# Patient Record
Sex: Female | Born: 1935
Health system: Southern US, Community
[De-identification: ages and names within clinical notes are randomized; demographics above are authoritative.]

## PROBLEM LIST (undated history)

## (undated) DIAGNOSIS — H3530 Unspecified macular degeneration: Secondary | ICD-10-CM

## (undated) DIAGNOSIS — C4491 Basal cell carcinoma of skin, unspecified: Secondary | ICD-10-CM

## (undated) HISTORY — PX: APPENDECTOMY: SHX54

## (undated) HISTORY — PX: ABDOMINAL HYSTERECTOMY: SHX81

## (undated) HISTORY — PX: CHOLECYSTECTOMY: SHX55

## (undated) HISTORY — DX: Basal cell carcinoma of skin, unspecified: C44.91

## (undated) HISTORY — DX: Unspecified macular degeneration: H35.30

---

## 2004-12-28 ENCOUNTER — Ambulatory Visit: Payer: Self-pay | Admitting: Family Medicine

## 2006-01-10 ENCOUNTER — Ambulatory Visit: Payer: Self-pay | Admitting: Family Medicine

## 2007-06-22 ENCOUNTER — Ambulatory Visit: Payer: Self-pay | Admitting: Family Medicine

## 2007-06-27 ENCOUNTER — Ambulatory Visit: Payer: Self-pay | Admitting: Family Medicine

## 2007-07-07 ENCOUNTER — Ambulatory Visit: Payer: Self-pay | Admitting: Surgery

## 2007-07-07 ENCOUNTER — Other Ambulatory Visit: Payer: Self-pay

## 2007-07-13 ENCOUNTER — Ambulatory Visit: Payer: Self-pay | Admitting: Surgery

## 2007-07-13 HISTORY — PX: BREAST EXCISIONAL BIOPSY: SUR124

## 2008-01-10 IMAGING — MG MAM BREAST NEEDLE LOCAL LT
1 series · 6 of 6 positions shown · non-contrast
Comparison: none

REASON FOR EXAM: left breast mass        surg 11am
COMMENTS:

[Series 6977: L LM · left · 6 of 6 slices shown]
[im 1/6]
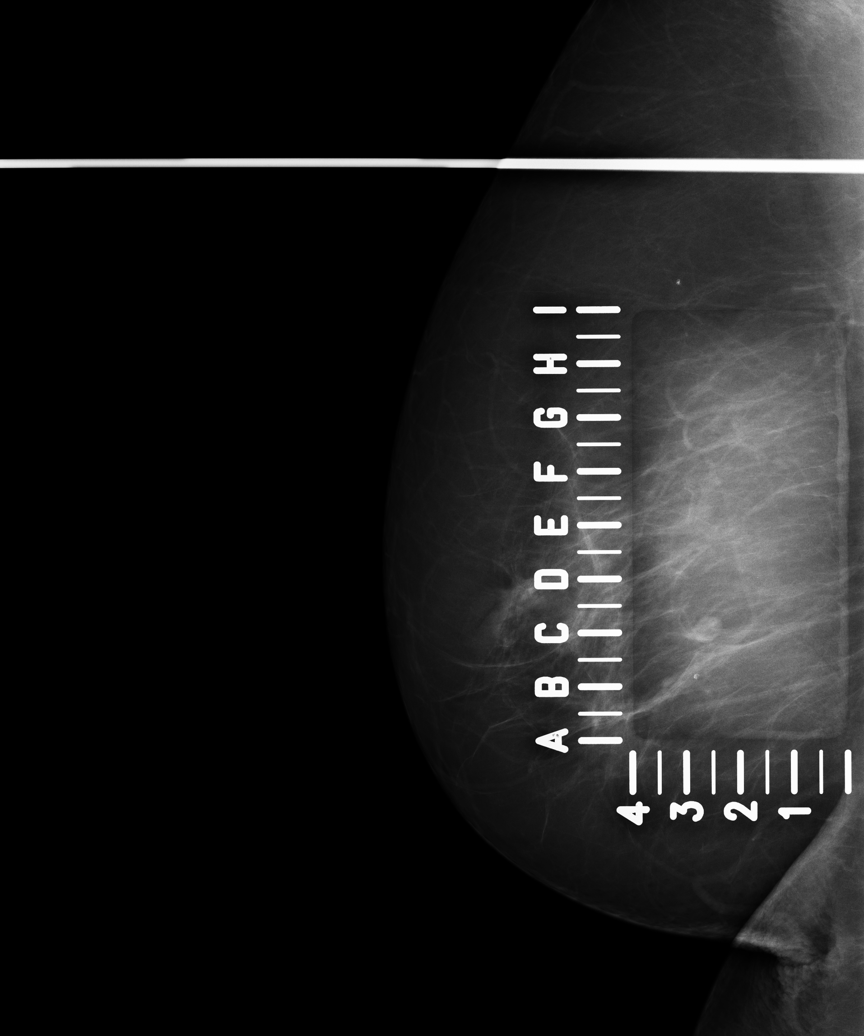
[im 2/6]
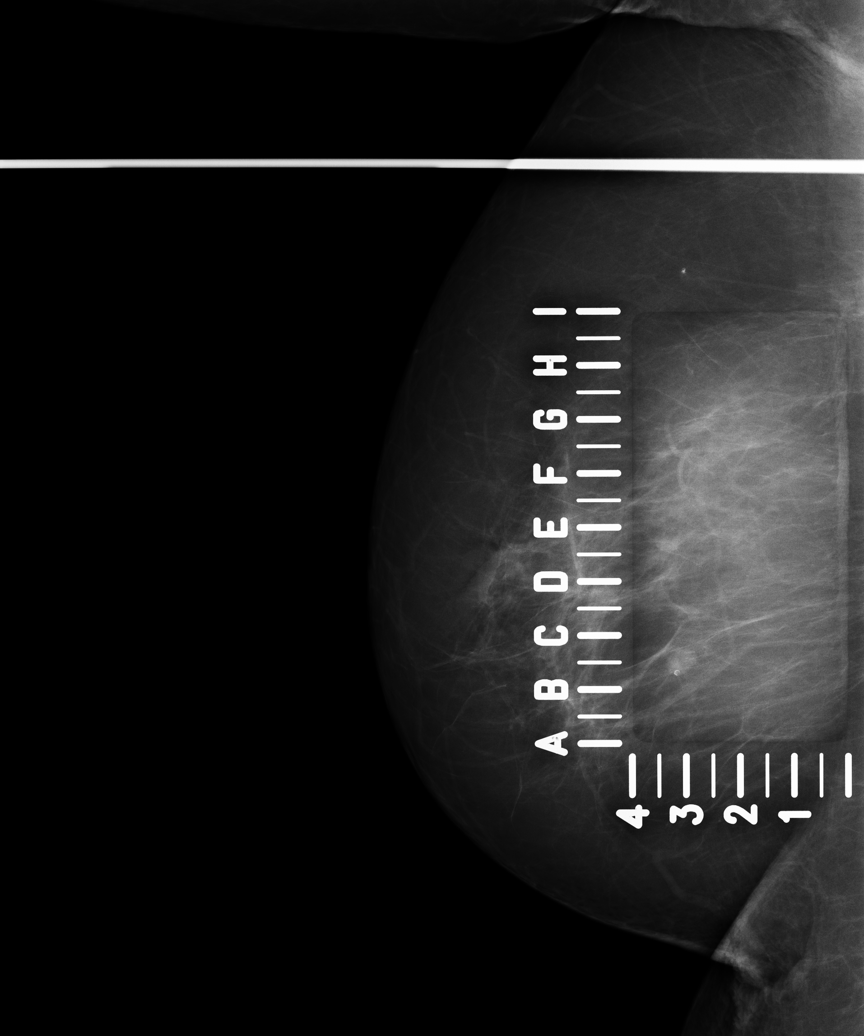
[im 3/6]
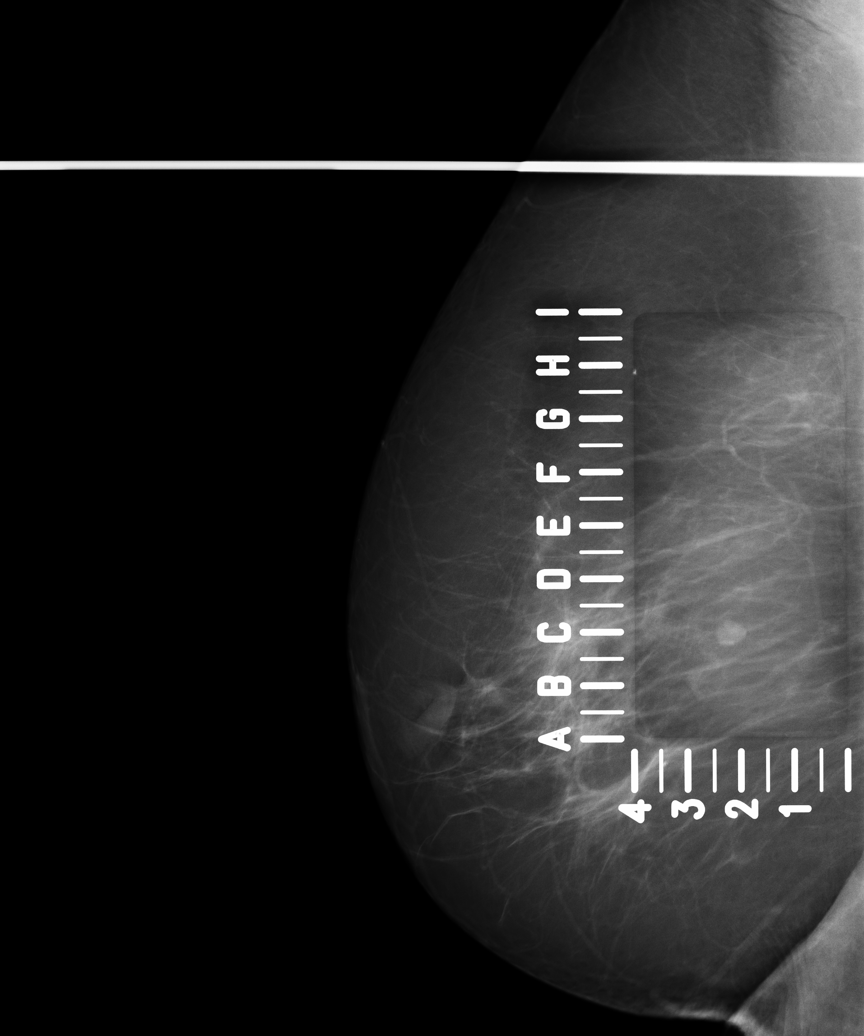
[im 4/6]
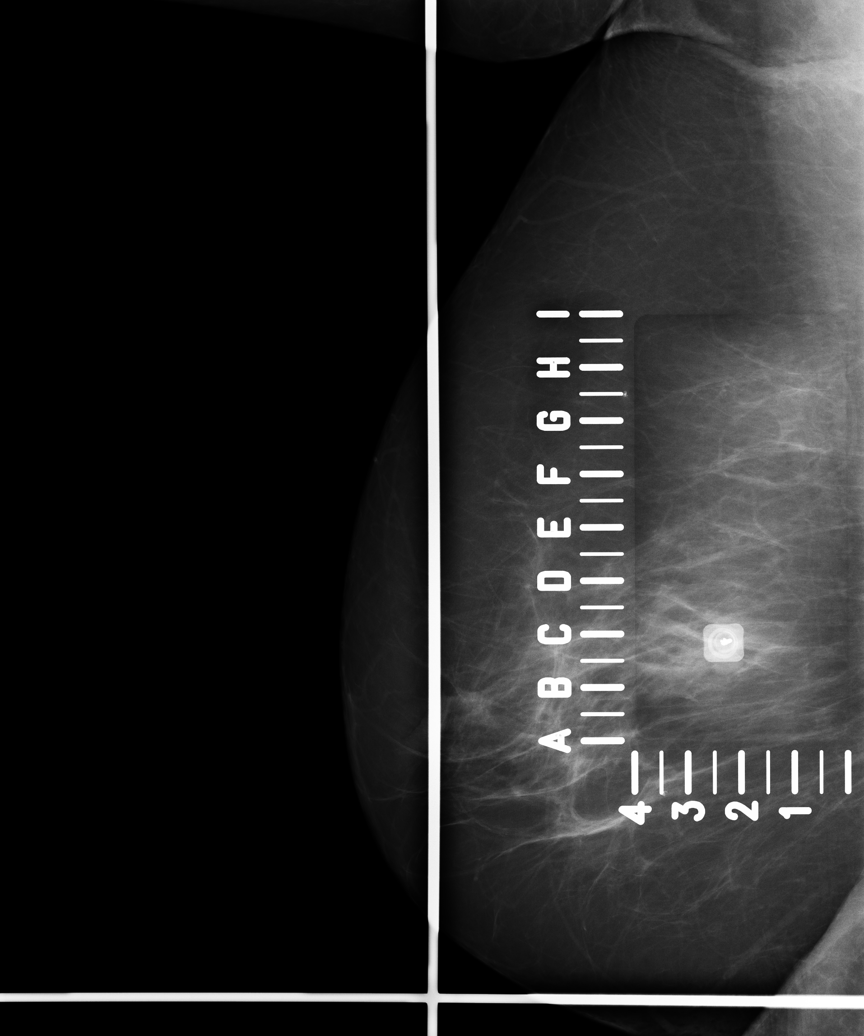
[im 5/6]
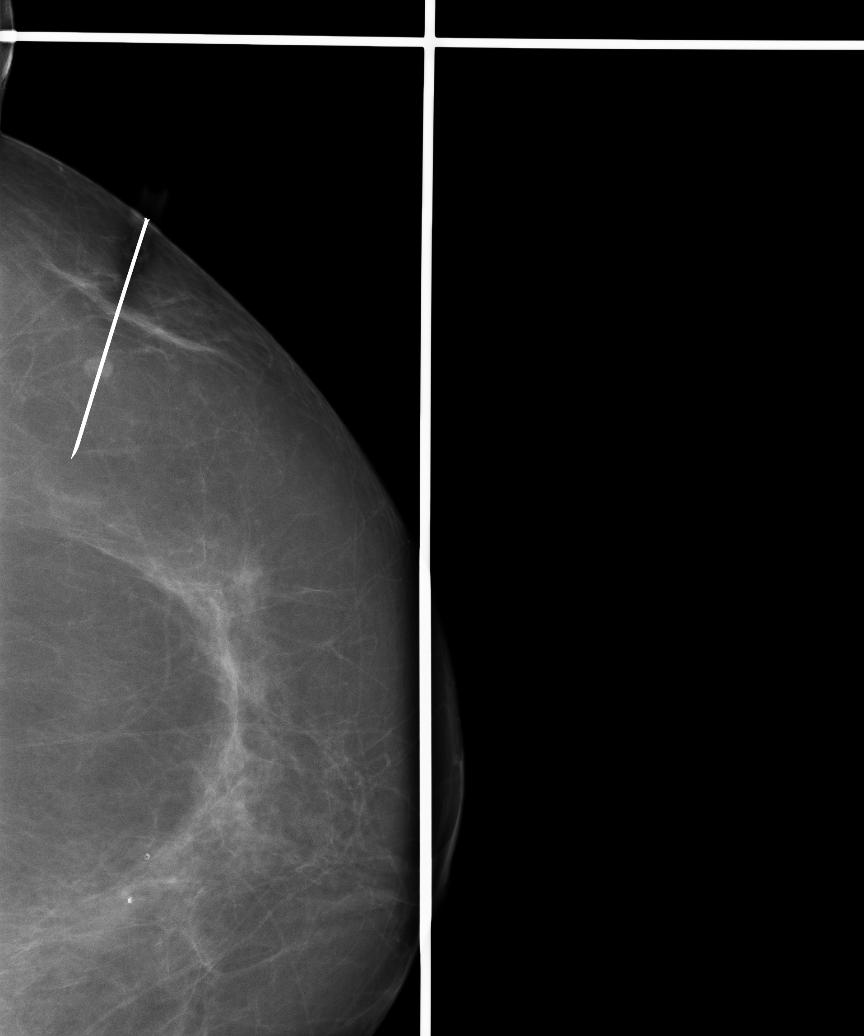
[im 6/6]
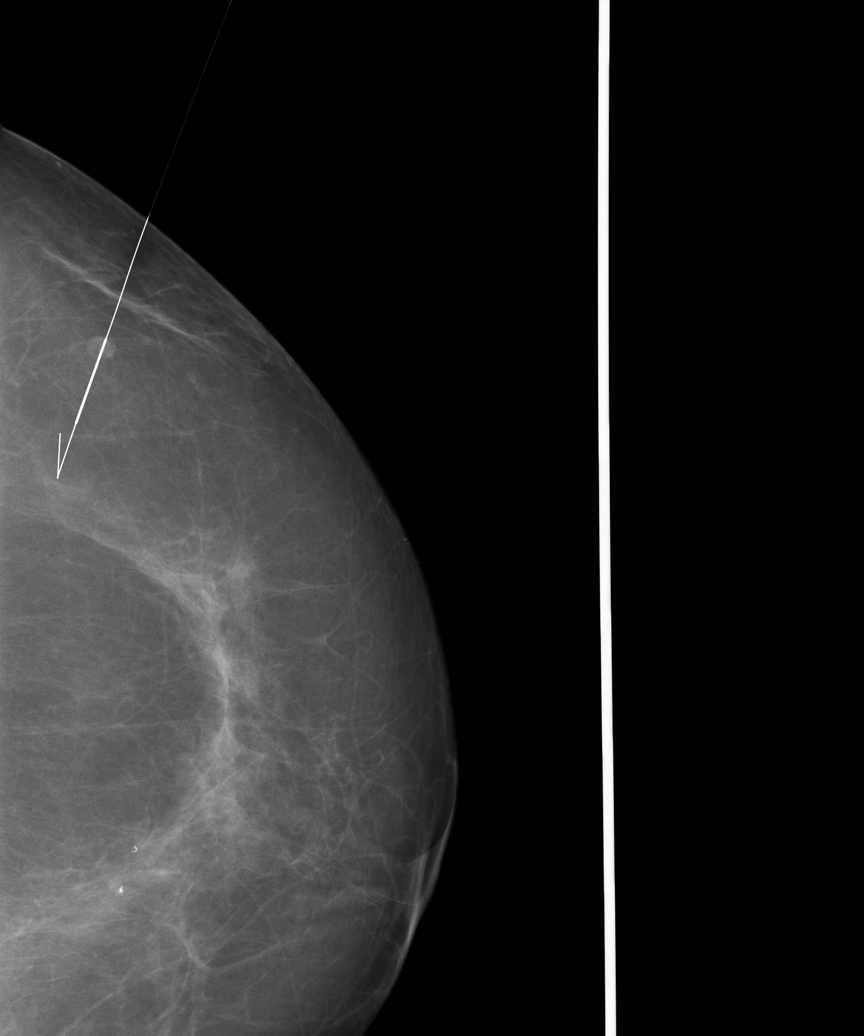

[6 of 6 positions shown; findings below may reference images not displayed]

PROCEDURE:     MAM - MAM BREAST NEEDLE LOCAL LT  - July 13, 2007  [DATE]

RESULT:     Comparison: Left mammogram on 06/22/07 and 06/27/2007.

Procedure:

The risks, benefits, and alternatives of the procedure were discussed with
the patient. Written and oral informed consent were obtained. The patient
was placed in coordinate grid and the 5 mm left outer breast nodule was
localized using digital imaging.

Using sterile technique and local anesthesia with 1% lidocaine, a Kopans
hook wire was placed at the level of the left outer breast nodule from a
lateral approach. The nodule is along the proximal reinforced portion of
wire.

Two views mammogram documenting position the wire were sent to the operating
room with the patient.

The patient tolerated procedure without immediate complications.
IMPRESSION: 1. Successful wire localization of suspicious 5 mm nodule involving the left
outer breast.

## 2009-05-29 ENCOUNTER — Ambulatory Visit: Payer: Self-pay | Admitting: Family Medicine

## 2010-06-04 ENCOUNTER — Ambulatory Visit: Payer: Self-pay | Admitting: Family Medicine

## 2011-06-17 ENCOUNTER — Ambulatory Visit: Payer: Self-pay | Admitting: Family Medicine

## 2012-07-04 ENCOUNTER — Ambulatory Visit: Payer: Self-pay | Admitting: Family Medicine

## 2013-07-17 ENCOUNTER — Ambulatory Visit: Payer: Self-pay | Admitting: Ophthalmology

## 2013-09-20 ENCOUNTER — Ambulatory Visit: Payer: Self-pay | Admitting: Family Medicine

## 2014-09-25 ENCOUNTER — Ambulatory Visit: Payer: Self-pay | Admitting: Family Medicine

## 2014-11-27 ENCOUNTER — Ambulatory Visit: Payer: Self-pay | Admitting: Ophthalmology

## 2014-12-30 DIAGNOSIS — Z961 Presence of intraocular lens: Secondary | ICD-10-CM | POA: Diagnosis not present

## 2015-06-30 DIAGNOSIS — Z961 Presence of intraocular lens: Secondary | ICD-10-CM | POA: Diagnosis not present

## 2016-03-19 ENCOUNTER — Ambulatory Visit (INDEPENDENT_AMBULATORY_CARE_PROVIDER_SITE_OTHER): Payer: Commercial Managed Care - HMO

## 2016-03-19 ENCOUNTER — Ambulatory Visit
Admission: EM | Admit: 2016-03-19 | Discharge: 2016-03-19 | Disposition: A | Discharge status: Home / Self Care | Payer: Commercial Managed Care - HMO | Attending: Family Medicine | Admitting: Family Medicine

## 2016-03-19 ENCOUNTER — Encounter: Payer: Self-pay | Admitting: Emergency Medicine

## 2016-03-19 DIAGNOSIS — S81811A Laceration without foreign body, right lower leg, initial encounter: Secondary | ICD-10-CM

## 2016-03-19 DIAGNOSIS — S8011XA Contusion of right lower leg, initial encounter: Secondary | ICD-10-CM

## 2016-03-19 DIAGNOSIS — M79604 Pain in right leg: Secondary | ICD-10-CM | POA: Diagnosis not present

## 2016-03-19 DIAGNOSIS — S80211A Abrasion, right knee, initial encounter: Secondary | ICD-10-CM

## 2016-03-19 DIAGNOSIS — W108XXA Fall (on) (from) other stairs and steps, initial encounter: Secondary | ICD-10-CM

## 2016-03-19 MED ORDER — TETANUS-DIPHTH-ACELL PERTUSSIS 5-2.5-18.5 LF-MCG/0.5 IM SUSP
0.5000 mL | Freq: Once | INTRAMUSCULAR | Status: AC
  Administered 2016-03-19: 0.5 mL via INTRAMUSCULAR

## 2016-03-19 MED ORDER — AMOXICILLIN-POT CLAVULANATE 875-125 MG PO TABS: 1.0000 | ORAL_TABLET | Freq: Two times a day (BID) | ORAL | Status: DC

## 2016-03-19 NOTE — ED Notes (Addendum)
Patient states that she fell as she was going outside on her porch and hit her right leg.  Patient has an abrasion on her right knee and a puncture wound to her right lower leg.  Patient has pain and swelling in her right lower leg at the puncture site.  Patient has some bleeding at this time.  Pressure dressing has been applied.

## 2016-03-19 NOTE — Discharge Instructions (Signed)
Take medication as prescribed. Rest. Drink plenty of fluids. Apply ice and elevate. Keep clean, clean daily with soap and water as discussed.   Follow up with your primary care physician this week, in 3 days for follow up.Call to schedule.   Return to Urgent care or ER for redness, swelling, drainage, uncontrolled bleeding, headache, chest pain, shortness of breath, new or worsening concerns.    Contusion A contusion is a deep bruise. Contusions are the result of a blunt injury to tissues and muscle fibers under the skin. The injury causes bleeding under the skin. The skin overlying the contusion may turn blue, purple, or yellow. Minor injuries will give you a painless contusion, but more severe contusions may stay painful and swollen for a few weeks.  CAUSES  This condition is usually caused by a blow, trauma, or direct force to an area of the body. SYMPTOMS  Symptoms of this condition include:  Swelling of the injured area.  Pain and tenderness in the injured area.  Discoloration. The area may have redness and then turn blue, purple, or yellow. DIAGNOSIS  This condition is diagnosed based on a physical exam and medical history. An X-ray, CT scan, or MRI may be needed to determine if there are any associated injuries, such as broken bones (fractures). TREATMENT  Specific treatment for this condition depends on what area of the body was injured. In general, the best treatment for a contusion is resting, icing, applying pressure to (compression), and elevating the injured area. This is often called the RICE strategy. Over-the-counter anti-inflammatory medicines may also be recommended for pain control.  HOME CARE INSTRUCTIONS   Rest the injured area.  If directed, apply ice to the injured area:  Put ice in a plastic bag.  Place a towel between your skin and the bag.  Leave the ice on for 20 minutes, 2-3 times per day.  If directed, apply light compression to the injured area using  an elastic bandage. Make sure the bandage is not wrapped too tightly. Remove and reapply the bandage as directed by your health care provider.  If possible, raise (elevate) the injured area above the level of your heart while you are sitting or lying down.  Take over-the-counter and prescription medicines only as told by your health care provider. SEEK MEDICAL CARE IF:  Your symptoms do not improve after several days of treatment.  Your symptoms get worse.  You have difficulty moving the injured area. SEEK IMMEDIATE MEDICAL CARE IF:   You have severe pain.  You have numbness in a hand or foot.  Your hand or foot turns pale or cold.   This information is not intended to replace advice given to you by your health care provider. Make sure you discuss any questions you have with your health care provider.   Document Released: 09/01/2005 Document Revised: 08/13/2015 Document Reviewed: 04/09/2015 Elsevier Interactive Patient Education 2016 [place] An abrasion is a cut or scrape on the outer surface of your skin. An abrasion does not extend through all of the layers of your skin. It is important to care for your abrasion properly to prevent infection. CAUSES Most abrasions are caused by falling on or gliding across the ground or another surface. When your skin rubs on something, the outer and inner layer of skin rubs off.  SYMPTOMS A cut or scrape is the main symptom of this condition. The scrape may be bleeding, or it may appear red or pink. If there  was an associated fall, there may be an underlying bruise. DIAGNOSIS An abrasion is diagnosed with a physical exam. TREATMENT Treatment for this condition depends on how large and deep the abrasion is. Usually, your abrasion will be cleaned with water and mild soap. This removes any dirt or debris that may be stuck. An antibiotic ointment may be applied to the abrasion to help prevent infection. A bandage (dressing) may be  placed on the abrasion to keep it clean. You may also need a tetanus shot. HOME CARE INSTRUCTIONS Medicines  Take or apply medicines only as directed by your health care provider.  If you were prescribed an antibiotic ointment, finish all of it even if you start to feel better. Wound Care  Clean the wound with mild soap and water 2-3 times per day or as directed by your health care provider. Pat your wound dry with a clean towel. Do not rub it.  There are many different ways to close and cover a wound. Follow instructions from your health care provider about:  Wound care.  Dressing changes and removal.  Check your wound every day for signs of infection. Watch for:  Redness, swelling, or pain.  Fluid, blood, or pus. General Instructions  Keep the dressing dry as directed by your health care provider. Do not take baths, swim, use a hot tub, or do anything that would put your wound underwater until your health care provider approves.  If there is swelling, raise (elevate) the injured area above the level of your heart while you are sitting or lying down.  Keep all follow-up visits as directed by your health care provider. This is important. SEEK MEDICAL CARE IF:  You received a tetanus shot and you have swelling, severe pain, redness, or bleeding at the injection site.  Your pain is not controlled with medicine.  You have increased redness, swelling, or pain at the site of your wound. SEEK IMMEDIATE MEDICAL CARE IF:  You have a red streak going away from your wound.  You have a fever.  You have fluid, blood, or pus coming from your wound.  You notice a bad smell coming from your wound or your dressing.   This information is not intended to replace advice given to you by your health care provider. Make sure you discuss any questions you have with your health care provider.   Document Released: 09/01/2005 Document Revised: 08/13/2015 Document Reviewed: 11/20/2014 Elsevier  Interactive Patient Education 2016 [place], Adult A laceration is a cut that goes through all layers of the skin. The cut also goes into the tissue that is right under the skin. Some cuts heal on their own. Others need to be closed with stitches (sutures), staples, skin adhesive strips, or wound glue. Taking care of your cut lowers your risk of infection and helps your cut to heal better. HOW TO TAKE CARE OF YOUR CUT For stitches or staples:  Keep the wound clean and dry.  If you were given a bandage (dressing), you should change it at least one time per day or as told by your doctor. You should also change it if it gets wet or dirty.  Keep the wound completely dry for the first 24 hours or as told by your doctor. After that time, you may take a shower or a bath. However, make sure that the wound is not soaked in water until after the stitches or staples have been removed.  Clean the wound one time  each day or as told by your doctor:  Wash the wound with soap and water.  Rinse the wound with water until all of the soap comes off.  Pat the wound dry with a clean towel. Do not rub the wound.  After you clean the wound, put a thin layer of antibiotic ointment on it as told by your doctor. This ointment:  Helps to prevent infection.  Keeps the bandage from sticking to the wound.  Have your stitches or staples removed as told by your doctor. If your doctor used skin adhesive strips:   Keep the wound clean and dry.  If you were given a bandage, you should change it at least one time per day or as told by your doctor. You should also change it if it gets dirty or wet.  Do not get the skin adhesive strips wet. You can take a shower or a bath, but be careful to keep the wound dry.  If the wound gets wet, pat it dry with a clean towel. Do not rub the wound.  Skin adhesive strips fall off on their own. You can trim the strips as the wound heals. Do not remove any  strips that are still stuck to the wound. They will fall off after a while. If your doctor used wound glue:  Try to keep your wound dry, but you may briefly wet it in the shower or bath. Do not soak the wound in water, such as by swimming.  After you take a shower or a bath, gently pat the wound dry with a clean towel. Do not rub the wound.  Do not do any activities that will make you really sweaty until the skin glue has fallen off on its own.  Do not apply liquid, cream, or ointment medicine to your wound while the skin glue is still on.  If you were given a bandage, you should change it at least one time per day or as told by your doctor. You should also change it if it gets dirty or wet.  If a bandage is placed over the wound, do not let the tape for the bandage touch the skin glue.  Do not pick at the glue. The skin glue usually stays on for 5-10 days. Then, it falls off of the skin. General Instructions  To help prevent scarring, make sure to cover your wound with sunscreen whenever you are outside after stitches are removed, after adhesive strips are removed, or when wound glue stays in place and the wound is healed. Make sure to wear a sunscreen of at least 30 SPF.  Take over-the-counter and prescription medicines only as told by your doctor.  If you were given antibiotic medicine or ointment, take or apply it as told by your doctor. Do not stop using the antibiotic even if your wound is getting better.  Do not scratch or pick at the wound.  Keep all follow-up visits as told by your doctor. This is important.  Check your wound every day for signs of infection. Watch for:  Redness, swelling, or pain.  Fluid, blood, or pus.  Raise (elevate) the injured area above the level of your heart while you are sitting or lying down, if possible. GET HELP IF:  You got a tetanus shot and you have any of these problems at the injection site:  Swelling.  Very bad  pain.  Redness.  Bleeding.  You have a fever.  A wound that was closed breaks  open.  You notice a bad smell coming from your wound or your bandage.  You notice something coming out of the wound, such as wood or glass.  Medicine does not help your pain.  You have more redness, swelling, or pain at the site of your wound.  You have fluid, blood, or pus coming from your wound.  You notice a change in the color of your skin near your wound.  You need to change the bandage often because fluid, blood, or pus is coming from the wound.  You start to have a new rash.  You start to have numbness around the wound. GET HELP RIGHT AWAY IF:  You have very bad swelling around the wound.  Your pain suddenly gets worse and is very bad.  You notice painful lumps near the wound or on skin that is anywhere on your body.  You have a red streak going away from your wound.  The wound is on your hand or foot and you cannot move a finger or toe like you usually can.  The wound is on your hand or foot and you notice that your fingers or toes look pale or bluish.   This information is not intended to replace advice given to you by your health care provider. Make sure you discuss any questions you have with your health care provider.   Document Released: 05/10/2008 Document Revised: 04/08/2015 Document Reviewed: 11/18/2014 Elsevier Interactive Patient Education [employer].

## 2016-03-19 NOTE — ED Provider Notes (Signed)
[facility]  ____________________________________________  Time seen: Approximately 9:59 AM  I have reviewed the triage vital signs and the nursing notes.   HISTORY  Chief Complaint Fall and Puncture Wound   HPI Kerri Harmon is a 82 y.o. female patient presents for the complaint of right leg pain and bleeding. Patient reports that yesterday evening approximately 4 PM she was going into her house. Patient reports that she has 3 or 4 steps that go up onto her porch in front of her front door. Patient states that her son had just left as she was outside talking to him. Patient states that she started going up the stairs and turned around and looked at his car driving away, and states that she tripped as she lifted her foot to go to the next step. Patient states that when she had her foot on the step causing her to trip forward and landed on her right knee. Patient states that her weight landed on her right knee. Patient states that she then did secondarily fall to her buttocks. Patient states that she feels that she may have hit her head on the handrail. Denies loss consciousness. Denies any other injury. Denies any recent falls.  Patient reports that she drove herself to the urgent care today. Patient states that she waited to come in until today as she was not hurting at the time. Patient states that she only has mild pain around the injury site. Patient reports that she came in tonight primarily because the wound area has continued to intermittently bleed.  Denies headache, vision changes, loss of consciousness, neck pain, back pain, chest pain, shortness of breath, weakness, dizziness, abdominal pain, other extremity pain. Patient reports that she fell only because she tripped. Patient reports that she felt well prior to falling and states that her only complaint after falling was her right leg. Reports has continued to remain ambulatory and active since the event.  PCP Dr.  Ronnald Ramp.  Reports unsure of last tetanus immunization.   History reviewed. No pertinent past medical history. Denies There are no active problems to display for this patient. Denies  Past Surgical History  Procedure Laterality Date  . Abdominal hysterectomy    . Cholecystectomy    . Appendectomy      Current Outpatient Rx  Name  Route  Sig  Dispense  Refill  . amoxicillin-clavulanate (AUGMENTIN) 875-125 MG tablet   Oral   Take 1 tablet by mouth every 12 (twelve) hours.   20 tablet   0     Allergies Review of patient's allergies indicates no known allergies.  History reviewed. No pertinent family history.  Social History Social History  Substance Use Topics  . Smoking status: Never Smoker   . Smokeless tobacco: None  . Alcohol Use: No    Review of Systems Constitutional: No fever/chills Eyes: No visual changes. ENT: No sore throat. Cardiovascular: Denies chest pain. Respiratory: Denies shortness of breath. Gastrointestinal: No abdominal pain.  No nausea, no vomiting.  No diarrhea.  No constipation. Genitourinary: Negative for dysuria. Musculoskeletal: Negative for back pain. Skin: Negative for rash.Right lower leg laceration. Neurological: Negative for headaches, focal weakness or numbness.  10-point ROS otherwise negative.  ____________________________________________   PHYSICAL EXAM:  VITAL SIGNS: ED Triage Vitals  Enc Vitals Group     BP 03/19/16 0833 168/69 mmHg     Pulse Rate 03/19/16 0833 66     Resp 03/19/16 0833 17     Temp 03/19/16 0833 97.8  F (36.6 C)     Temp Source 03/19/16 0833 Tympanic     SpO2 03/19/16 0833 100 %     Weight 03/19/16 0833 130 lb (58.968 kg)     Height 03/19/16 0833 5\' 3"  (1.6 m)     Head Cir --      Peak Flow --      Pain Score 03/19/16 0837 3     Pain Loc --      Pain Edu? --      Excl. in [place]? --     Constitutional: Alert and oriented. Well appearing and in no acute distress. Eyes: Conjunctivae are normal.  PERRL. EOMI. Head: Very small yellowish healing ecchymosis to left forehead, nontender, no swelling, skin intact. No other ecchymosis, swelling, hematoma or signs of trauma..  Ears: no erythema, normal TMs bilaterally.   Nose: No congestion/rhinnorhea.  Mouth/Throat: Mucous membranes are moist.  Oropharynx non-erythematous. Neck: No stridor.  No cervical spine tenderness to palpation. Hematological/Lymphatic/Immunilogical: No cervical lymphadenopathy. Cardiovascular: Normal rate, regular rhythm. Grossly normal heart sounds.  Good peripheral circulation. Chest nontender. Respiratory: Normal respiratory effort.  No retractions. Lungs CTAB. No wheezes, rales or rhonchi. Gastrointestinal: Soft and nontender. No distention. Normal Bowel sounds. No CVA tenderness. Musculoskeletal: No lower or upper extremity tenderness nor edema.  No joint effusions. Bilateral pedal pulses equal and easily palpated. No cervical, thoracic or lumbar tenderness to palpation. Except: Right anterior knee with superficial abrasions 2, nontender, no ecchymosis, no swelling, full range of motion, no pain to the knee, and knee stable. Right proximal tibial area with moderate ecchymosis, mild swelling and with 0.25 cm laceration present without active bleeding, no foreign bodies visualized, full range of motion, no calf tenderness, no posterior leg tenderness, no lower leg swelling, bilateral pedal pulses equal and easily palpable, and plantar and dorsal flexion strong and equal, 2+ Achilles reflex bilaterally, steady gait in room, changes position quickly from sitting to standing and ambulating.  Neurologic:  Normal speech and language. No gross focal neurologic deficits are appreciated. No gait instability. GCS 15. Steady gait. No ataxia. Normal finger to nose. 5 out of 5 strength to bilateral upper and lower extremities. Cranial nerves II-12 grossly intact. Skin:  Skin is warm, dry and intact. No rash noted. See musculoskeletal  above. Psychiatric: Mood and affect are normal. Speech and behavior are normal.  ____________________________________________   LABS (all labs ordered are listed, but only abnormal results are displayed)  Labs Reviewed - No data to display rADIOLOGY   EXAM: RIGHT TIBIA AND FIBULA - 2 VIEW  COMPARISON: None.  FINDINGS: There is no evidence of fracture or other focal bone lesions. Soft tissues are unremarkable.  IMPRESSION: No acute abnormality noted.   Electronically Signed By: Inez Catalina M.D. On: 03/19/2016 09:13  I, Marylene Land, personally viewed and evaluated these images (plain radiographs) as part of my medical decision making, as well as reviewing the written report by the radiologist.  ____________________________________________   PROCEDURES  Procedure(s) performed:Procedure(s) performed:  Procedure explained and verbal consent obtained. Consent: Verbal consent obtained. Written consent not obtained. Risks and benefits: risks, benefits and alternatives were discussed Patient identity confirmed: verbally with patient and hospital-assigned identification number  Consent given by: patient   Laceration Repair Location: Right proximal tibial area Length: 0.25 cm Foreign bodies: no foreign bodies Tendon involvement: none Nerve involvement: none Preparation: Patient was prepped and draped in the usual sterile fashion. Anesthesia : Patient denies need for Irrigation solution: saline and Betadine  Irrigation  method: jet lavage Amount of cleaning: copious No repair indicated. Approximation: loose Patient tolerate well. Wound well approximated post repair.  Surgicel applied to the wound. Antibiotic ointment and dressing applied.  Wound care instructions provided.  Observe for any signs of infection or other problems.   \________________________________________   INITIAL IMPRESSION / ASSESSMENT AND PLAN / ED COURSE  Pertinent labs & imaging results  that were available during my care of the patient were reviewed by me and considered in my medical decision making (see chart for details).  Very well-appearing patient. No acute distress. Presents for complaints of right proximal tibial pain and swelling with wound which occurred from a mechanical fall yesterday afternoon. Patient reports going up her steps into her house and tripped causing her to fall forward. Patient states that she thinks she tripped over the third step. Denies any other pain or injury. Denies loss of consciousness. No focal neurological deficits. Alert and oriented. Patient denies headache, vision changes, weakness, dizziness, nausea, vomiting . She reports felt fine prior to fall and after expect right shin. Patient with 0.25 cm wound to proximal tibial area as well as superficial abrasions. Wound has been open greater than 12 hours, no closure indicated. Wound copiously irrigated with Betadine and saline. Surgicel applied to the wound. Directed and cleaning and monitoring area. As well as can greater than 12 hours will place patient on oral Augmentin. Encourage PCP follow up in 2 days. Denies pain at this time. Tetanus immunization updated. Encouraged patient to monitor blood pressure at home and follow up with her primary care physician. Patient states her blood pressures is elevated today as she is stressed that she does not want to be in urgent care at this time.   Discussed follow up with Primary care physician this week. Discussed follow up and return parameters including no resolution or any worsening concerns. Patient verbalized understanding and agreed to plan.   ____________________________________________   FINAL CLINICAL IMPRESSION(S) / ED DIAGNOSES  Final diagnoses:  Contusion of right leg, initial encounter  Abrasion of right knee, initial encounter  Leg laceration, right, initial encounter  Fall (on) (from) other stairs and steps, initial encounter       Note: This dictation was prepared with Dragon dictation along with smaller phrase technology. Any transcriptional errors that result from this process are unintentional.    Marylene Land, NP 03/19/16 2326

## 2016-03-22 ENCOUNTER — Ambulatory Visit (INDEPENDENT_AMBULATORY_CARE_PROVIDER_SITE_OTHER): Payer: Commercial Managed Care - HMO | Admitting: Family Medicine

## 2016-03-22 ENCOUNTER — Encounter: Payer: Self-pay | Admitting: Family Medicine

## 2016-03-22 VITALS — BP 128/78 | HR 60 | Ht 63.0 in | Wt 134.0 lb

## 2016-03-22 DIAGNOSIS — R030 Elevated blood-pressure reading, without diagnosis of hypertension: Secondary | ICD-10-CM | POA: Diagnosis not present

## 2016-03-22 DIAGNOSIS — S8011XA Contusion of right lower leg, initial encounter: Secondary | ICD-10-CM | POA: Insufficient documentation

## 2016-03-22 NOTE — Progress Notes (Signed)
Name: Kerri Harmon   MRN: [id number]    DOB: 27-Oct-1931   Date:03/22/2016       Progress Note  Subjective  Chief Complaint  Chief Complaint  Patient presents with  . Fall    follow up urgent care/fell x 4 days ago- "lost my balance on the last step and fell"    Fall Pertinent negatives include no abdominal pain, fever, headaches, hematuria, nausea or tingling.  Hypertension This is a chronic problem. The current episode started more than 1 year ago. The problem has been gradually improving since onset. The problem is controlled. Pertinent negatives include no anxiety, blurred vision, chest pain, headaches, malaise/fatigue, neck pain, orthopnea, palpitations, peripheral edema, PND, shortness of breath or sweats. There are no associated agents to hypertension. Risk factors for coronary artery disease include post-menopausal state. Past treatments include nothing. The current treatment provides mild improvement. There are no compliance problems.  There is no history of angina, kidney disease, CAD/MI, CVA, heart failure, left ventricular hypertrophy, PVD, renovascular disease or retinopathy. There is no history of chronic renal disease or a hypertension causing med.  Laceration  The incident occurred 5 to 7 days ago. The laceration is located on the right leg. The laceration is 1 cm in size. The laceration mechanism was a blunt object. The pain is mild. The pain has been intermittent since onset. She reports no foreign bodies present. Her tetanus status is UTD.    No problem-specific assessment & plan notes found for this encounter.   History reviewed. No pertinent past medical history.  Past Surgical History  Procedure Laterality Date  . Abdominal hysterectomy    . Cholecystectomy    . Appendectomy      History reviewed. No pertinent family history.  Social History   Social History  . Marital Status: Widowed    Spouse Name: N/A  . Number of Children: N/A  . Years of  Education: N/A   Occupational History  . Not on file.   Social History Main Topics  . Smoking status: Never Smoker   . Smokeless tobacco: Not on file  . Alcohol Use: No  . Drug Use: Not on file  . Sexual Activity: No   Other Topics Concern  . Not on file   Social History Narrative    No Known Allergies   Review of Systems  Constitutional: Negative for fever, chills, weight loss and malaise/fatigue.  HENT: Negative for ear discharge, ear pain and sore throat.   Eyes: Negative for blurred vision.  Respiratory: Negative for cough, sputum production, shortness of breath and wheezing.   Cardiovascular: Negative for chest pain, palpitations, orthopnea, leg swelling and PND.  Gastrointestinal: Negative for heartburn, nausea, abdominal pain, diarrhea, constipation, blood in stool and melena.  Genitourinary: Negative for dysuria, urgency, frequency and hematuria.  Musculoskeletal: Negative for myalgias, back pain, joint pain and neck pain.  Skin: Negative for rash.  Neurological: Negative for dizziness, tingling, sensory change, focal weakness and headaches.  Endo/Heme/Allergies: Negative for environmental allergies and polydipsia. Does not bruise/bleed easily.  Psychiatric/Behavioral: Negative for depression and suicidal ideas. The patient is not nervous/anxious and does not have insomnia.      Objective  Filed Vitals:   03/22/16 0916  BP: 128/78  Pulse: 60  Height: 5\' 3"  (1.6 m)  Weight: 134 lb (60.782 kg)    Physical Exam  Constitutional: She is well-developed, well-nourished, and in no distress. No distress.  HENT:  Head: Normocephalic and atraumatic.  Right Ear: External ear  normal.  Left Ear: External ear normal.  Nose: Nose normal.  Mouth/Throat: Oropharynx is clear and moist.  Eyes: Conjunctivae and EOM are normal. Pupils are equal, round, and reactive to light. Right eye exhibits no discharge. Left eye exhibits no discharge.  Neck: Normal range of motion. Neck  supple. No JVD present. No thyromegaly present.  Cardiovascular: Normal rate, regular rhythm, normal heart sounds and intact distal pulses.  Exam reveals no gallop and no friction rub.   No murmur heard. Pulmonary/Chest: Effort normal and breath sounds normal.  Abdominal: Soft. Bowel sounds are normal. She exhibits no mass. There is no tenderness. There is no guarding.  Musculoskeletal: Normal range of motion. She exhibits no edema.  Lymphadenopathy:    She has no cervical adenopathy.  Neurological: She is alert. She has normal reflexes.  Skin: Skin is warm and dry. She is not diaphoretic.  Psychiatric: Mood and affect normal.  Nursing note and vitals reviewed.     Assessment & Plan  Problem List Items Addressed This Visit    None    Visit Diagnoses    Elevated blood pressure (not hypertension)    -  Primary      Walked pt to urgent care for redressing   Dr. Otilio Miu [facility]  03/22/2016

## 2016-03-22 NOTE — Patient Instructions (Signed)

## 2016-05-04 ENCOUNTER — Ambulatory Visit
Admission: EM | Admit: 2016-05-04 | Discharge: 2016-05-04 | Disposition: A | Discharge status: Home / Self Care | Payer: Commercial Managed Care - HMO | Attending: Family Medicine | Admitting: Family Medicine

## 2016-05-04 ENCOUNTER — Encounter: Payer: Self-pay | Admitting: *Deleted

## 2016-05-04 DIAGNOSIS — H6981 Other specified disorders of Eustachian tube, right ear: Secondary | ICD-10-CM | POA: Diagnosis not present

## 2016-05-04 DIAGNOSIS — H9193 Unspecified hearing loss, bilateral: Secondary | ICD-10-CM | POA: Diagnosis not present

## 2016-05-04 MED ORDER — FEXOFENADINE-PSEUDOEPHED ER 60-120 MG PO TB12: 1.0000 | ORAL_TABLET | Freq: Two times a day (BID) | ORAL | Status: DC

## 2016-05-04 MED ORDER — FLUTICASONE PROPIONATE 50 MCG/ACT NA SUSP: 2.0000 | Freq: Every day | NASAL | Status: DC

## 2016-05-04 NOTE — ED Provider Notes (Signed)
CSN: [id number]     Arrival date & time 05/04/16  [id number] History   First MD Initiated Contact with Patient 05/04/16 [phone]    Nurses notes were reviewed. Chief Complaint  Patient presents with  . Ear Fullness    Patient has had no ear fullness and decreased hearing over the last several months. She denies any fever or new injury but states that her hearing is progressively getting worse. She reports sometimes with friends and said that she's having some wheezing but she is not coughing up anything. She states that she does not have any medical problems she still works a lot at home she motor 2 acre property yesterday and that she is on no medication at this time. She reports the hearing is becoming increasingly worse.    (Consider location/radiation/quality/duration/timing/severity/associated sxs/prior Treatment) Patient is a 82 y.o. female presenting with plugged ear sensation. The history is provided by the patient. No language interpreter was used.  Ear Fullness This is a new problem. The current episode started more than 1 week ago. The problem occurs constantly. The problem has been gradually worsening. Pertinent negatives include no chest pain, no abdominal pain, no headaches and no shortness of breath. Nothing aggravates the symptoms. Nothing relieves the symptoms. She has tried nothing for the symptoms. The treatment provided no relief.    History reviewed. No pertinent past medical history. Past Surgical History  Procedure Laterality Date  . Abdominal hysterectomy    . Cholecystectomy    . Appendectomy     History reviewed. No pertinent family history. Social History  Substance Use Topics  . Smoking status: Never Smoker   . Smokeless tobacco: None  . Alcohol Use: No   OB History    No data available     Review of Systems  Respiratory: Negative for shortness of breath.   Cardiovascular: Negative for chest pain.  Gastrointestinal: Negative for abdominal pain.  Neurological:  Negative for headaches.  All other systems reviewed and are negative.   Allergies  Review of patient's allergies indicates no known allergies.  Home Medications   Prior to Admission medications   Medication Sig Start Date End Date Taking? Authorizing Provider  amoxicillin-clavulanate (AUGMENTIN) 875-125 MG tablet Take 1 tablet by mouth every 12 (twelve) hours. 03/19/16   Marylene Land, NP  fexofenadine-pseudoephedrine (ALLEGRA-D) 60-120 MG 12 hr tablet Take 1 tablet by mouth every 12 (twelve) hours. 05/04/16   Frederich Cha, MD  fluticasone (FLONASE) 50 MCG/ACT nasal spray Place 2 sprays into both nostrils daily. 05/04/16   Frederich Cha, MD   Meds Ordered and Administered this Visit  Medications - No data to display  BP 140/80 mmHg  Pulse 64  Temp(Src) 98.1 F (36.7 C) (Oral)  Resp 18  Ht 5\' 3"  (1.6 m)  Wt 140 lb (63.504 kg)  BMI 24.81 kg/m2  SpO2 99% No data found.   Physical Exam  Constitutional: She is oriented to person, place, and time. She appears well-developed and well-nourished.  Elderly white female  HENT:  Head: Normocephalic and atraumatic.  Right Ear: External ear and ear canal normal. Tympanic membrane is bulging. A middle ear effusion is present. Decreased hearing is noted.  Left Ear: External ear and ear canal normal. A middle ear effusion is present.  Nose: Mucosal edema and rhinorrhea present. Right sinus exhibits no maxillary sinus tenderness and no frontal sinus tenderness. Left sinus exhibits no maxillary sinus tenderness.  Mouth/Throat: Uvula is midline and mucous membranes are normal. No dental abscesses or  dental caries.  Patient effusion of behind both ears with the right being worse than the left  Eyes: Conjunctivae are normal. Pupils are equal, round, and reactive to light.  Neck: Normal range of motion. Neck supple.  Cardiovascular: Normal rate and regular rhythm.   Pulmonary/Chest: Effort normal and breath sounds normal.  No wheezing could be heard  during the examination  Musculoskeletal: Normal range of motion.  Lymphadenopathy:    She has no cervical adenopathy.  Neurological: She is alert and oriented to person, place, and time.  Skin: Skin is warm.  Psychiatric: She has a normal mood and affect. Her behavior is normal.  Vitals reviewed.   ED Course  Procedures (including critical care time)  Labs Review Labs Reviewed - No data to display  Imaging Review No results found.   Visual Acuity Review  Right Eye Distance:   Left Eye Distance:   Bilateral Distance:    Right Eye Near:   Left Eye Near:    Bilateral Near:         MDM   1. Eustachian tube dysfunction, right   2. Hearing impaired, bilateral    Explained to patient that she has eustachian tube dysfunction. Explained to her we will need to put her on steroid nasal spray and decongestant. Tried to explain to her why we are treating her nose when her ears feel stopped up. I also explained to her that if this doesn't help I would recommend her seeing ENT and possible being fitted for hearing aids. She is informing that she does not want hearing aids because they are too expensive. Also went over the Valsalva maneuver to try to open up the eustachian tubes which she had difficulty following understanding.  I've explained to her that the wheezing that her friends reported may be coming from blockage of her airways were nostril but that's positive lungs were concerned they were very clear today.  We'll place her on Allegra-D D 12 hours 1 tablet once to twice a day Flonase nasal spray 2 puffs each nostril daily. Follow-up with Dr. Ronnald Ramp as needed  Note: This dictation was prepared with Dragon dictation along with smaller phrase technology. Any transcriptional errors that result from this process are unintentional.    Frederich Cha, MD 05/04/16 1133

## 2016-05-04 NOTE — Discharge Instructions (Signed)
Hearing Loss Hearing loss is a partial or total loss of the ability to hear. This can be temporary or permanent, and it can happen in one or both ears. Hearing loss may be referred to as deafness. Medical care is necessary to treat hearing loss properly and to prevent the condition from getting worse. Your hearing may partially or completely come back, depending on what caused your hearing loss and how severe it is. In some cases, hearing loss is permanent. CAUSES Common causes of hearing loss include:   Too much wax in the ear canal.   Infection of the ear canal or middle ear.   Fluid in the middle ear.   Injury to the ear or surrounding area.   An object stuck in the ear.   Prolonged exposure to loud sounds, such as music.  Less common causes of hearing loss include:   Tumors in the ear.   Viral or bacterial infections, such as meningitis.   A hole in the eardrum (perforated eardrum).  Problems with the hearing nerve that sends signals between the brain and the ear.  Certain medicines.  SYMPTOMS  Symptoms of this condition may include:  Difficulty telling the difference between sounds.  Difficulty following a conversation when there is background noise.  Lack of response to sounds in your environment. This may be most noticeable when you do not respond to startling sounds.  Needing to turn up the volume on the television, radio, etc.  Ringing in the ears.  Dizziness.  Pain in the ears. DIAGNOSIS This condition is diagnosed based on a physical exam and a hearing test (audiometry). The audiometry test will be performed by a hearing specialist (audiologist). You may also be referred to an ear, nose, and throat (ENT) specialist (otolaryngologist).  TREATMENT Treatment for recent onset of hearing loss may include:   Ear wax removal.   Being prescribed medicines to prevent infection (antibiotics).   Being prescribed medicines to reduce inflammation  (corticosteroids).  HOME CARE INSTRUCTIONS  If you were prescribed an antibiotic medicine, take it as told by your health care provider. Do not stop taking the antibiotic even if you start to feel better.  Take over-the-counter and prescription medicines only as told by your health care provider.  Avoid loud noises.   Return to your normal activities as told by your health care provider. Ask your health care provider what activities are safe for you.  Keep all follow-up visits as told by your health care provider. This is important. SEEK MEDICAL CARE IF:   You feel dizzy.   You develop new symptoms.   You vomit or feel nauseous.   You have a fever.  SEEK IMMEDIATE MEDICAL CARE IF:  You develop sudden changes in your vision.   You have severe ear pain.   You have new or increased weakness.  You have a severe headache.   This information is not intended to replace advice given to you by your health care provider. Make sure you discuss any questions you have with your health care provider.   Document Released: 11/22/2005 Document Revised: 08/13/2015 Document Reviewed: 04/09/2015 Elsevier Interactive Patient Education 2016 [employer] is inflammation of your middle ear. This occurs when the auditory tube (eustachian tube) leading from the back of your nose (nasopharynx) to your eardrum is blocked. This blockage may result from a cold, environmental allergies, or an upper respiratory infection. Unresolved barotitis media may lead to damage or hearing loss (barotrauma), which  may become permanent. HOME CARE INSTRUCTIONS   Use medicines as recommended by your health care provider. Over-the-counter medicines will help unblock the canal and can help during times of air travel.  Do not put anything into your ears to clean or unplug them. Eardrops will not be helpful.  Do not swim, dive, or fly until your health care provider says it is all  right to do so. If these activities are necessary, chewing gum with frequent, forceful swallowing may help. It is also helpful to hold your nose and gently blow to pop your ears for equalizing pressure changes. This forces air into the eustachian tube.  Only take over-the-counter or prescription medicines for pain, discomfort, or fever as directed by your health care provider.  A decongestant may be helpful in decongesting the middle ear and make pressure equalization easier. SEEK MEDICAL CARE IF:  You experience a serious form of dizziness in which you feel as if the room is spinning and you feel nauseated (vertigo).  Your symptoms only involve one ear. SEEK IMMEDIATE MEDICAL CARE IF:   You develop a severe headache, dizziness, or severe ear pain.  You have bloody or pus-like drainage from your ears.  You develop a fever.  Your problems do not improve or become worse. MAKE SURE YOU:   Understand these instructions.  Will watch your condition.  Will get help right away if you are not doing well or get worse.   This information is not intended to replace advice given to you by your health care provider. Make sure you discuss any questions you have with your health care provider.   Document Released: 11/19/2000 Document Revised: 09/12/2013 Document Reviewed: 06/19/2013 Elsevier Interactive Patient Education [employer].

## 2016-05-04 NOTE — ED Notes (Signed)
Patient started having reduce hearing and ear fullness in the past year. No other symptom reported.

## 2016-05-13 ENCOUNTER — Ambulatory Visit (INDEPENDENT_AMBULATORY_CARE_PROVIDER_SITE_OTHER): Payer: Commercial Managed Care - HMO | Admitting: Family Medicine

## 2016-05-13 ENCOUNTER — Encounter: Payer: Self-pay | Admitting: Family Medicine

## 2016-05-13 VITALS — BP 130/70 | HR 78 | Ht 63.0 in | Wt 143.0 lb

## 2016-05-13 DIAGNOSIS — H6503 Acute serous otitis media, bilateral: Secondary | ICD-10-CM | POA: Diagnosis not present

## 2016-05-13 DIAGNOSIS — H8303 Labyrinthitis, bilateral: Secondary | ICD-10-CM | POA: Diagnosis not present

## 2016-05-13 DIAGNOSIS — Z23 Encounter for immunization: Secondary | ICD-10-CM | POA: Diagnosis not present

## 2016-05-13 DIAGNOSIS — J0110 Acute frontal sinusitis, unspecified: Secondary | ICD-10-CM

## 2016-05-13 MED ORDER — MECLIZINE HCL 25 MG PO TABS: 25.0000 mg | ORAL_TABLET | Freq: Three times a day (TID) | ORAL | Status: DC | PRN

## 2016-05-13 MED ORDER — AMOXICILLIN 500 MG PO CAPS: 500.0000 mg | ORAL_CAPSULE | Freq: Two times a day (BID) | ORAL | Status: DC

## 2016-05-13 NOTE — Progress Notes (Signed)
Name: Kerri Harmon   MRN: [id number]    DOB: 1931/07/19   Date:05/13/2016       Progress Note  Subjective  Chief Complaint  Chief Complaint  Patient presents with  . Sinusitis    ear discomfort and loss of balance    Sinusitis This is a new problem. The current episode started in the past 7 days. The problem has been waxing and waning since onset. There has been no fever. The pain is moderate. Associated symptoms include ear pain and headaches. Pertinent negatives include no chills, congestion, coughing, diaphoresis, hoarse voice, neck pain, shortness of breath, sinus pressure, sneezing, sore throat or swollen glands. Treatments tried: fluticasone.  Otalgia  There is pain in the right ear. This is a new problem. The current episode started in the past 7 days. The problem has been gradually worsening. There has been no fever. The pain is moderate. Associated symptoms include headaches and hearing loss. Pertinent negatives include no abdominal pain, coughing, diarrhea, ear discharge, neck pain, rash, rhinorrhea, sore throat or vomiting. The treatment provided no relief.  Dizziness This is a new problem. The current episode started in the past 7 days. Associated symptoms include headaches and vertigo. Pertinent negatives include no abdominal pain, chest pain, chills, congestion, coughing, diaphoresis, fever, myalgias, nausea, neck pain, rash, sore throat, swollen glands or vomiting. Associated symptoms comments: frontal.    No problem-specific assessment & plan notes found for this encounter.   History reviewed. No pertinent past medical history.  Past Surgical History  Procedure Laterality Date  . Abdominal hysterectomy    . Cholecystectomy    . Appendectomy      History reviewed. No pertinent family history.  Social History   Social History  . Marital Status: Widowed    Spouse Name: N/A  . Number of Children: N/A  . Years of Education: N/A   Occupational History  . Not on  file.   Social History Main Topics  . Smoking status: Never Smoker   . Smokeless tobacco: Not on file  . Alcohol Use: No  . Drug Use: No  . Sexual Activity: No   Other Topics Concern  . Not on file   Social History Narrative    No Known Allergies   Review of Systems  Constitutional: Negative for fever, chills, weight loss, malaise/fatigue and diaphoresis.  HENT: Positive for ear pain and hearing loss. Negative for congestion, ear discharge, hoarse voice, rhinorrhea, sinus pressure, sneezing and sore throat.   Eyes: Negative for blurred vision.  Respiratory: Negative for cough, sputum production, shortness of breath and wheezing.   Cardiovascular: Negative for chest pain, palpitations and leg swelling.  Gastrointestinal: Negative for heartburn, nausea, vomiting, abdominal pain, diarrhea, constipation, blood in stool and melena.  Genitourinary: Negative for dysuria, urgency, frequency and hematuria.  Musculoskeletal: Negative for myalgias, back pain, joint pain and neck pain.  Skin: Negative for rash.  Neurological: Positive for dizziness, vertigo and headaches. Negative for tingling, sensory change and focal weakness.  Endo/Heme/Allergies: Negative for environmental allergies and polydipsia. Does not bruise/bleed easily.  Psychiatric/Behavioral: Negative for depression and suicidal ideas. The patient is not nervous/anxious and does not have insomnia.      Objective  Filed Vitals:   05/13/16 0920  BP: 130/70  Pulse: 78  Height: 5\' 3"  (1.6 m)  Weight: 143 lb (64.864 kg)    Physical Exam  Constitutional: She is well-developed, well-nourished, and in no distress. No distress.  HENT:  Head: Normocephalic and atraumatic.  Right Ear: External ear and ear canal normal. A middle ear effusion is present.  Left Ear: External ear and ear canal normal. A middle ear effusion is present.  Nose: Mucosal edema present.  Mouth/Throat: Uvula is midline and oropharynx is clear and  moist.  Eyes: Conjunctivae and EOM are normal. Pupils are equal, round, and reactive to light. Right eye exhibits no discharge. Left eye exhibits no discharge.  Neck: Normal range of motion. Neck supple. No JVD present. No thyromegaly present.  Cardiovascular: Normal rate, regular rhythm, normal heart sounds and intact distal pulses.  Exam reveals no gallop and no friction rub.   No murmur heard. Pulmonary/Chest: Effort normal and breath sounds normal. She has no wheezes. She has no rales.  Abdominal: Soft. Bowel sounds are normal. She exhibits no mass. There is no tenderness. There is no guarding.  Musculoskeletal: Normal range of motion. She exhibits no edema.  Lymphadenopathy:    She has no cervical adenopathy.  Neurological: She is alert.  Skin: Skin is warm and dry. She is not diaphoretic.  Psychiatric: Mood and affect normal.  Nursing note and vitals reviewed.     Assessment & Plan  Problem List Items Addressed This Visit    None    Visit Diagnoses    Acute frontal sinusitis, recurrence not specified    -  Primary    Relevant Medications    amoxicillin (AMOXIL) 500 MG capsule    Bilateral acute serous otitis media, recurrence not specified        cont fluticasone/allegra    Relevant Medications    amoxicillin (AMOXIL) 500 MG capsule    Labyrinthitis of both ears        Relevant Medications    meclizine (ANTIVERT) 25 MG tablet    Need for pneumococcal vaccination        Relevant Orders    Pneumococcal conjugate vaccine 13-valent (Completed)         Dr. Lot Medford [facility]  05/13/2016

## 2016-05-24 ENCOUNTER — Ambulatory Visit (INDEPENDENT_AMBULATORY_CARE_PROVIDER_SITE_OTHER): Payer: Commercial Managed Care - HMO | Admitting: Family Medicine

## 2016-05-24 ENCOUNTER — Encounter: Payer: Self-pay | Admitting: Family Medicine

## 2016-05-24 VITALS — BP 120/80 | HR 64 | Ht 63.0 in | Wt 144.0 lb

## 2016-05-24 DIAGNOSIS — J309 Allergic rhinitis, unspecified: Secondary | ICD-10-CM

## 2016-05-24 NOTE — Progress Notes (Signed)
Name: Kerri Harmon   MRN: [id number]    DOB: 1931-01-04   Date:05/24/2016       Progress Note  Subjective  Chief Complaint  Chief Complaint  Patient presents with  . Follow-up    finished Aug yesterday- wanted to follow up to make sure "it was cleared up"    Sinusitis This is a recurrent problem. The current episode started 1 to 4 weeks ago. The problem has been gradually improving since onset. There has been no fever. The pain is mild. Pertinent negatives include no chills, congestion, coughing, diaphoresis, ear pain, headaches, hoarse voice, neck pain, shortness of breath, sinus pressure, sneezing, sore throat or swollen glands. Past treatments include antibiotics. The treatment provided moderate relief.    No problem-specific assessment & plan notes found for this encounter.   History reviewed. No pertinent past medical history.  Past Surgical History  Procedure Laterality Date  . Abdominal hysterectomy    . Cholecystectomy    . Appendectomy      History reviewed. No pertinent family history.  Social History   Social History  . Marital Status: Widowed    Spouse Name: N/A  . Number of Children: N/A  . Years of Education: N/A   Occupational History  . Not on file.   Social History Main Topics  . Smoking status: Never Smoker   . Smokeless tobacco: Not on file  . Alcohol Use: No  . Drug Use: No  . Sexual Activity: No   Other Topics Concern  . Not on file   Social History Narrative    No Known Allergies   Review of Systems  Constitutional: Negative for fever, chills, weight loss, malaise/fatigue and diaphoresis.  HENT: Negative for congestion, ear discharge, ear pain, hoarse voice, sinus pressure, sneezing and sore throat.   Eyes: Negative for blurred vision.  Respiratory: Negative for cough, sputum production, shortness of breath and wheezing.   Cardiovascular: Negative for chest pain, palpitations and leg swelling.  Gastrointestinal: Negative for  heartburn, nausea, abdominal pain, diarrhea, constipation, blood in stool and melena.  Genitourinary: Negative for dysuria, urgency, frequency and hematuria.  Musculoskeletal: Negative for myalgias, back pain, joint pain and neck pain.  Skin: Negative for rash.  Neurological: Negative for dizziness, tingling, sensory change, focal weakness and headaches.  Endo/Heme/Allergies: Negative for environmental allergies and polydipsia. Does not bruise/bleed easily.  Psychiatric/Behavioral: Negative for depression and suicidal ideas. The patient is not nervous/anxious and does not have insomnia.      Objective  Filed Vitals:   05/24/16 1034  BP: 120/80  Pulse: 64  Height: 5\' 3"  (1.6 m)  Weight: 144 lb (65.318 kg)    Physical Exam  Constitutional: She is well-developed, well-nourished, and in no distress. No distress.  HENT:  Head: Normocephalic and atraumatic.  Right Ear: Tympanic membrane, external ear and ear canal normal.  Left Ear: External ear and ear canal normal. A middle ear effusion is present.  Nose: Nose normal.  Mouth/Throat: Oropharynx is clear and moist.  Eyes: Conjunctivae and EOM are normal. Pupils are equal, round, and reactive to light. Right eye exhibits no discharge. Left eye exhibits no discharge.  Neck: Normal range of motion. Neck supple. No JVD present. No thyromegaly present.  Cardiovascular: Normal rate, regular rhythm, normal heart sounds and intact distal pulses.  Exam reveals no gallop and no friction rub.   No murmur heard. Pulmonary/Chest: Effort normal and breath sounds normal.  Abdominal: Soft. Bowel sounds are normal. She exhibits no mass. There  is no tenderness. There is no guarding.  Musculoskeletal: Normal range of motion. She exhibits no edema.  Lymphadenopathy:    She has no cervical adenopathy.  Neurological: She is alert. She has normal reflexes.  Skin: Skin is warm and dry. She is not diaphoretic.  Psychiatric: Mood and affect normal.  Nursing  note and vitals reviewed.     Assessment & Plan  Problem List Items Addressed This Visit    None    Visit Diagnoses    Allergic sinusitis    -  Primary    continue flonase/allegra         Dr. Otilio Miu [facility]  05/24/2016

## 2017-05-04 ENCOUNTER — Other Ambulatory Visit: Payer: Self-pay

## 2017-05-04 ENCOUNTER — Telehealth: Payer: Self-pay

## 2017-05-04 DIAGNOSIS — R21 Rash and other nonspecific skin eruption: Secondary | ICD-10-CM

## 2017-05-04 MED ORDER — NYSTATIN 100000 UNIT/GM EX CREA: 1.0000 "application " | TOPICAL_CREAM | Freq: Two times a day (BID) | CUTANEOUS | 0 refills | Status: DC

## 2017-05-04 NOTE — Telephone Encounter (Signed)
Pt walked in and wanted Nystatin cream called in to [facility]- sent in by computer

## 2017-05-26 DIAGNOSIS — R0781 Pleurodynia: Secondary | ICD-10-CM | POA: Diagnosis not present

## 2017-05-26 DIAGNOSIS — G9520 Unspecified cord compression: Secondary | ICD-10-CM | POA: Diagnosis not present

## 2017-05-26 DIAGNOSIS — R079 Chest pain, unspecified: Secondary | ICD-10-CM | POA: Diagnosis not present

## 2017-05-30 ENCOUNTER — Ambulatory Visit: Payer: Commercial Managed Care - HMO | Admitting: Family Medicine

## 2017-05-30 ENCOUNTER — Encounter: Payer: Self-pay | Admitting: Family Medicine

## 2017-05-30 ENCOUNTER — Ambulatory Visit (INDEPENDENT_AMBULATORY_CARE_PROVIDER_SITE_OTHER): Payer: Commercial Managed Care - HMO | Admitting: Family Medicine

## 2017-05-30 VITALS — BP 138/84 | HR 62 | Temp 98.3°F | Resp 17 | Ht 63.0 in | Wt 151.0 lb

## 2017-05-30 DIAGNOSIS — R030 Elevated blood-pressure reading, without diagnosis of hypertension: Secondary | ICD-10-CM

## 2017-05-30 DIAGNOSIS — B3789 Other sites of candidiasis: Secondary | ICD-10-CM

## 2017-05-30 DIAGNOSIS — R21 Rash and other nonspecific skin eruption: Secondary | ICD-10-CM

## 2017-05-30 DIAGNOSIS — Z09 Encounter for follow-up examination after completed treatment for conditions other than malignant neoplasm: Secondary | ICD-10-CM

## 2017-05-30 MED ORDER — NYSTATIN 100000 UNIT/GM EX CREA: 1.0000 "application " | TOPICAL_CREAM | Freq: Two times a day (BID) | CUTANEOUS | 0 refills | Status: DC

## 2017-05-30 NOTE — Patient Instructions (Signed)

## 2017-05-30 NOTE — Progress Notes (Addendum)
Name: Kerri Harmon   MRN: [id number]    DOB: 1931-12-03   Date:05/30/2017       Progress Note  Subjective  Chief Complaint  Chief Complaint  Patient presents with  . Follow-up    [facility] ED 05/26/17    Chest Pain   Chronicity: followup fron er. The current episode started 1 to 4 weeks ago. The problem has been resolved. The pain is present in the substernal region. The patient is experiencing no pain. The pain does not radiate. Pertinent negatives include no abdominal pain, back pain, cough, diaphoresis, dizziness, exertional chest pressure, fever, headaches, irregular heartbeat, leg pain, lower extremity edema, malaise/fatigue, nausea, near-syncope, palpitations, shortness of breath or sputum production. The pain is aggravated by nothing. She has tried nothing for the symptoms. The treatment provided significant relief.    No problem-specific Assessment & Plan notes found for this encounter.   No past medical history on file.  Past Surgical History:  Procedure Laterality Date  . ABDOMINAL HYSTERECTOMY    . APPENDECTOMY    . CHOLECYSTECTOMY      No family history on file.  Social History   Social History  . Marital status: Widowed    Spouse name: N/A  . Number of children: N/A  . Years of education: N/A   Occupational History  . Not on file.   Social History Main Topics  . Smoking status: Never Smoker  . Smokeless tobacco: Current User    Types: Snuff  . Alcohol use No  . Drug use: No  . Sexual activity: No   Other Topics Concern  . Not on file   Social History Narrative  . No narrative on file    No Known Allergies  Outpatient Medications Prior to Visit  Medication Sig Dispense Refill  . nystatin cream (MYCOSTATIN) Apply 1 application topically 2 (two) times daily. 30 g 0  . fexofenadine-pseudoephedrine (ALLEGRA-D) 60-120 MG 12 hr tablet Take 1 tablet by mouth every 12 (twelve) hours. 30 tablet 0  . fluticasone (FLONASE) 50 MCG/ACT nasal spray Place 2  sprays into both nostrils daily. 16 g 0  . meclizine (ANTIVERT) 25 MG tablet Take 1 tablet (25 mg total) by mouth 3 (three) times daily as needed for dizziness. 30 tablet 0   No facility-administered medications prior to visit.     Review of Systems  Constitutional: Negative for chills, diaphoresis, fever, malaise/fatigue and weight loss.  HENT: Negative for ear discharge, ear pain and sore throat.   Eyes: Negative for blurred vision.  Respiratory: Negative for cough, sputum production, shortness of breath and wheezing.   Cardiovascular: Negative for chest pain, palpitations, leg swelling and near-syncope.  Gastrointestinal: Negative for abdominal pain, blood in stool, constipation, diarrhea, heartburn, melena and nausea.  Genitourinary: Negative for dysuria, frequency, hematuria and urgency.  Musculoskeletal: Negative for back pain, joint pain, myalgias and neck pain.  Skin: Negative for rash.  Neurological: Negative for dizziness, tingling, sensory change, focal weakness and headaches.  Endo/Heme/Allergies: Negative for environmental allergies and polydipsia. Does not bruise/bleed easily.  Psychiatric/Behavioral: Negative for depression and suicidal ideas. The patient is not nervous/anxious and does not have insomnia.      Objective  Vitals:   05/30/17 1005  BP: 138/84  Pulse: 62  Resp: 17  Temp: 98.3 F (36.8 C)  TempSrc: Oral  SpO2: 97%  Weight: 151 lb (68.5 kg)  Height: 5\' 3"  (1.6 m)    Physical Exam  Constitutional: She is well-developed, well-nourished, and in no  distress. No distress.  HENT:  Head: Normocephalic and atraumatic.  Right Ear: External ear normal.  Left Ear: External ear normal.  Nose: Nose normal.  Mouth/Throat: Oropharynx is clear and moist.  Eyes: Conjunctivae and EOM are normal. Pupils are equal, round, and reactive to light. Right eye exhibits no discharge. Left eye exhibits no discharge.  Neck: Normal range of motion. Neck supple. No JVD  present. No thyromegaly present.  Cardiovascular: Normal rate, regular rhythm, normal heart sounds and intact distal pulses.  Exam reveals no gallop and no friction rub.   No murmur heard. Pulmonary/Chest: Effort normal and breath sounds normal.  Abdominal: Soft. Bowel sounds are normal. She exhibits no mass. There is no tenderness. There is no guarding.  Musculoskeletal: Normal range of motion. She exhibits no edema.  Lymphadenopathy:    She has no cervical adenopathy.  Neurological: She is alert.  Skin: Skin is warm and dry. She is not diaphoretic.  Psychiatric: Mood and affect normal.  Nursing note and vitals reviewed.     Assessment & Plan  Problem List Items Addressed This Visit    None    Visit Diagnoses    Elevated blood pressure reading without diagnosis of hypertension    -  Primary   Candidiasis of breast       Relevant Medications   nystatin cream (MYCOSTATIN)   Rash of body       Relevant Medications   nystatin cream (MYCOSTATIN)      Meds ordered this encounter  Medications  . nystatin cream (MYCOSTATIN)    Sig: Apply 1 application topically 2 (two) times daily.    Dispense:  30 g    Refill:  0      Dr. [facility]  05/30/17

## 2017-09-29 ENCOUNTER — Other Ambulatory Visit: Payer: Self-pay | Admitting: Family Medicine

## 2017-09-29 DIAGNOSIS — R21 Rash and other nonspecific skin eruption: Secondary | ICD-10-CM

## 2018-03-01 ENCOUNTER — Other Ambulatory Visit: Payer: Self-pay

## 2018-03-01 DIAGNOSIS — R21 Rash and other nonspecific skin eruption: Secondary | ICD-10-CM

## 2018-03-01 MED ORDER — NYSTATIN 100000 UNIT/GM EX CREA: 1.0000 "application " | TOPICAL_CREAM | Freq: Two times a day (BID) | CUTANEOUS | 0 refills | Status: DC

## 2018-03-07 ENCOUNTER — Encounter: Payer: Self-pay | Admitting: Family Medicine

## 2018-03-07 ENCOUNTER — Ambulatory Visit (INDEPENDENT_AMBULATORY_CARE_PROVIDER_SITE_OTHER): Payer: Medicare HMO | Admitting: Family Medicine

## 2018-03-07 VITALS — BP 120/80 | HR 88 | Ht 63.0 in | Wt 151.0 lb

## 2018-03-07 DIAGNOSIS — B372 Candidiasis of skin and nail: Secondary | ICD-10-CM

## 2018-03-07 DIAGNOSIS — R21 Rash and other nonspecific skin eruption: Secondary | ICD-10-CM | POA: Diagnosis not present

## 2018-03-07 DIAGNOSIS — J069 Acute upper respiratory infection, unspecified: Secondary | ICD-10-CM | POA: Diagnosis not present

## 2018-03-07 MED ORDER — MONTELUKAST SODIUM 10 MG PO TABS
10.0000 mg | ORAL_TABLET | Freq: Every day | ORAL | 3 refills | Status: DC
Start: 2018-03-07 — End: 2018-05-30

## 2018-03-07 MED ORDER — NYSTATIN 100000 UNIT/GM EX CREA: TOPICAL_CREAM | CUTANEOUS | 2 refills | Status: DC

## 2018-03-07 NOTE — Progress Notes (Signed)
Name: Kerri Harmon   MRN: [id number]    DOB: [birth year]   Date:03/07/2018       Progress Note  Subjective  Chief Complaint  Chief Complaint  Patient presents with  . Rash    is using nystatin cream for the creases of thighs. a little better, but not cleared up  . Sinusitis    green production, started Sunday    Rash  This is a recurrent problem. The current episode started more than 1 month ago. The problem has been waxing and waning since onset. The affected locations include the groin. The rash is characterized by itchiness and redness. She was exposed to nothing. Associated symptoms include congestion. Pertinent negatives include no anorexia, cough, diarrhea, eye pain, facial edema, fatigue, fever, joint pain, nail changes, rhinorrhea, shortness of breath, sore throat or vomiting. The treatment provided moderate relief.  Sinusitis  This is a new problem. The current episode started in the past 7 days. The problem has been waxing and waning since onset. Associated symptoms include congestion and sneezing. Pertinent negatives include no chills, coughing, diaphoresis, ear pain, headaches, hoarse voice, neck pain, shortness of breath, sinus pressure, sore throat or swollen glands. Past treatments include nothing. The treatment provided mild relief.    No problem-specific Assessment & Plan notes found for this encounter.   History reviewed. No pertinent past medical history.  Past Surgical History:  Procedure Laterality Date  . ABDOMINAL HYSTERECTOMY    . APPENDECTOMY    . CHOLECYSTECTOMY      History reviewed. No pertinent family history.  Social History   Socioeconomic History  . Marital status: Widowed    Spouse name: Not on file  . Number of children: Not on file  . Years of education: Not on file  . Highest education level: Not on file  Occupational History  . Not on file  Social Needs  . Financial resource strain: Not on file  . Food insecurity:    Worry: Not on  file    Inability: Not on file  . Transportation needs:    Medical: Not on file    Non-medical: Not on file  Tobacco Use  . Smoking status: Never Smoker  . Smokeless tobacco: Current User    Types: Snuff  Substance and Sexual Activity  . Alcohol use: No  . Drug use: No  . Sexual activity: Never  Lifestyle  . Physical activity:    Days per week: Not on file    Minutes per session: Not on file  . Stress: Not on file  Relationships  . Social connections:    Talks on phone: Not on file    Gets together: Not on file    Attends religious service: Not on file    Active member of club or organization: Not on file    Attends meetings of clubs or organizations: Not on file    Relationship status: Not on file  . Intimate partner violence:    Fear of current or ex partner: Not on file    Emotionally abused: Not on file    Physically abused: Not on file    Forced sexual activity: Not on file  Other Topics Concern  . Not on file  Social History Narrative  . Not on file    No Known Allergies  Outpatient Medications Prior to Visit  Medication Sig Dispense Refill  . nystatin cream (MYCOSTATIN) APPLY  CREAM TOPICALLY TO AFFECTED AREA TWICE DAILY 30 g 0  . nystatin  cream (MYCOSTATIN) Apply 1 application topically 2 (two) times daily. 15 g 0   No facility-administered medications prior to visit.     Review of Systems  Constitutional: Negative for chills, diaphoresis, fatigue, fever, malaise/fatigue and weight loss.  HENT: Positive for congestion and sneezing. Negative for ear discharge, ear pain, hoarse voice, rhinorrhea, sinus pressure and sore throat.   Eyes: Negative for blurred vision and pain.  Respiratory: Negative for cough, sputum production, shortness of breath and wheezing.   Cardiovascular: Negative for chest pain, palpitations and leg swelling.  Gastrointestinal: Negative for abdominal pain, anorexia, blood in stool, constipation, diarrhea, heartburn, melena, nausea and  vomiting.  Genitourinary: Negative for dysuria, frequency, hematuria and urgency.  Musculoskeletal: Negative for back pain, joint pain, myalgias and neck pain.  Skin: Positive for rash. Negative for nail changes.  Neurological: Negative for dizziness, tingling, sensory change, focal weakness and headaches.  Endo/Heme/Allergies: Negative for environmental allergies and polydipsia. Does not bruise/bleed easily.  Psychiatric/Behavioral: Negative for depression and suicidal ideas. The patient is not nervous/anxious and does not have insomnia.      Objective  Vitals:   03/07/18 0804  BP: 120/80  Pulse: 88  Weight: 151 lb (68.5 kg)  Height: 5\' 3"  (1.6 m)    Physical Exam  Constitutional: She is well-developed, well-nourished, and in no distress. No distress.  HENT:  Head: Normocephalic and atraumatic.  Right Ear: External ear normal.  Left Ear: External ear normal.  Nose: Nose normal.  Mouth/Throat: Oropharynx is clear and moist.  Eyes: Pupils are equal, round, and reactive to light. Conjunctivae and EOM are normal. Right eye exhibits no discharge. Left eye exhibits no discharge.  Neck: Normal range of motion. Neck supple. No JVD present. No thyromegaly present.  Cardiovascular: Normal rate, regular rhythm, normal heart sounds and intact distal pulses. Exam reveals no gallop and no friction rub.  No murmur heard. Pulmonary/Chest: Effort normal and breath sounds normal. She has no wheezes. She has no rales.  Abdominal: Soft. Bowel sounds are normal. She exhibits no mass. There is no tenderness. There is no guarding.  Musculoskeletal: Normal range of motion. She exhibits no edema.  Lymphadenopathy:    She has no cervical adenopathy.  Neurological: She is alert. She has normal reflexes.  Skin: Skin is warm and dry. She is not diaphoretic.  Psychiatric: Mood and affect normal.  Nursing note and vitals reviewed.     Assessment & Plan  Problem List Items Addressed This Visit     None    Visit Diagnoses    Viral upper respiratory tract infection    -  Primary   Relevant Medications   nystatin cream (MYCOSTATIN)   montelukast (SINGULAIR) 10 MG tablet   Rash of body       Relevant Medications   nystatin cream (MYCOSTATIN)   Candidiasis of skin       Relevant Medications   nystatin cream (MYCOSTATIN)      Meds ordered this encounter  Medications  . nystatin cream (MYCOSTATIN)    Sig: APPLY  CREAM TOPICALLY TO AFFECTED AREA TWICE DAILY    Dispense:  60 g    Refill:  2    Please consider 90 day supplies to promote better adherence  . montelukast (SINGULAIR) 10 MG tablet    Sig: Take 1 tablet (10 mg total) by mouth at bedtime.    Dispense:  30 tablet    Refill:  3      Dr. Otilio Miu [facility]  03/07/18

## 2018-05-30 ENCOUNTER — Other Ambulatory Visit: Payer: Self-pay | Admitting: Family Medicine

## 2018-05-30 ENCOUNTER — Ambulatory Visit (INDEPENDENT_AMBULATORY_CARE_PROVIDER_SITE_OTHER): Payer: Medicare HMO | Admitting: Family Medicine

## 2018-05-30 ENCOUNTER — Encounter: Payer: Self-pay | Admitting: Family Medicine

## 2018-05-30 VITALS — BP 120/68 | HR 64 | Ht 63.0 in | Wt 146.0 lb

## 2018-05-30 DIAGNOSIS — L03313 Cellulitis of chest wall: Secondary | ICD-10-CM

## 2018-05-30 DIAGNOSIS — W57XXXA Bitten or stung by nonvenomous insect and other nonvenomous arthropods, initial encounter: Secondary | ICD-10-CM | POA: Diagnosis not present

## 2018-05-30 MED ORDER — DOXYCYCLINE HYCLATE 100 MG PO TABS: 100.0000 mg | ORAL_TABLET | Freq: Two times a day (BID) | ORAL | 0 refills | Status: DC

## 2018-05-30 MED ORDER — MUPIROCIN 2 % EX OINT: 1.0000 "application " | TOPICAL_OINTMENT | Freq: Two times a day (BID) | CUTANEOUS | 0 refills | Status: DC

## 2018-05-30 NOTE — Progress Notes (Signed)
Name: Kerri Harmon   MRN: [id number]    DOB: 1931/04/24   Date:05/30/2018       Progress Note  Subjective  Chief Complaint  Chief Complaint  Patient presents with  . Tick Removal    pt pulled tick off R) breast 2 days ago- place on breast is yellow. Just wanted "you to check it"    Patient removed tick from right breast on Sunday. Partial remnants likely.Verbal consent for scalpel scaping was obtained. Appropriate cleansing performed and # 15 applied to previous tick bite wound located on right breast. Procedure tolerated well.  Aftercare Instructions:  Risks of bleeding and infection  were discussed.  Patient has been advised to keep areas clean and dressing with Bactroban. Educated about signs and symptoms of infection. All of the patient's questions were answered and the patient's concerns were addressed. Advised to return for re-treatment as needed.   No problem-specific Assessment & Plan notes found for this encounter.   History reviewed. No pertinent past medical history.  Past Surgical History:  Procedure Laterality Date  . ABDOMINAL HYSTERECTOMY    . APPENDECTOMY    . CHOLECYSTECTOMY      History reviewed. No pertinent family history.  Social History   Socioeconomic History  . Marital status: Widowed    Spouse name: Not on file  . Number of children: Not on file  . Years of education: Not on file  . Highest education level: Not on file  Occupational History  . Not on file  Social Needs  . Financial resource strain: Not on file  . Food insecurity:    Worry: Not on file    Inability: Not on file  . Transportation needs:    Medical: Not on file    Non-medical: Not on file  Tobacco Use  . Smoking status: Never Smoker  . Smokeless tobacco: Current User    Types: Snuff  Substance and Sexual Activity  . Alcohol use: No  . Drug use: No  . Sexual activity: Never  Lifestyle  . Physical activity:    Days per week: Not on file    Minutes per session: Not  on file  . Stress: Not on file  Relationships  . Social connections:    Talks on phone: Not on file    Gets together: Not on file    Attends religious service: Not on file    Active member of club or organization: Not on file    Attends meetings of clubs or organizations: Not on file    Relationship status: Not on file  . Intimate partner violence:    Fear of current or ex partner: Not on file    Emotionally abused: Not on file    Physically abused: Not on file    Forced sexual activity: Not on file  Other Topics Concern  . Not on file  Social History Narrative  . Not on file    No Known Allergies  Outpatient Medications Prior to Visit  Medication Sig Dispense Refill  . montelukast (SINGULAIR) 10 MG tablet Take 10 mg by mouth at bedtime.    . montelukast (SINGULAIR) 10 MG tablet Take 1 tablet (10 mg total) by mouth at bedtime. 30 tablet 3  . nystatin cream (MYCOSTATIN) APPLY  CREAM TOPICALLY TO AFFECTED AREA TWICE DAILY 60 g 2   No facility-administered medications prior to visit.     Review of Systems  Constitutional: Negative for chills, fever, malaise/fatigue and weight loss.  HENT: Negative  for ear discharge, ear pain and sore throat.   Eyes: Negative for blurred vision.  Respiratory: Negative for cough, sputum production, shortness of breath and wheezing.   Cardiovascular: Negative for chest pain, palpitations and leg swelling.  Gastrointestinal: Negative for abdominal pain, blood in stool, constipation, diarrhea, heartburn, melena and nausea.  Genitourinary: Negative for dysuria, frequency, hematuria and urgency.  Musculoskeletal: Negative for back pain, joint pain, myalgias and neck pain.  Skin: Negative for rash.  Neurological: Negative for dizziness, tingling, sensory change, focal weakness and headaches.  Endo/Heme/Allergies: Negative for environmental allergies and polydipsia. Does not bruise/bleed easily.  Psychiatric/Behavioral: Negative for depression and  suicidal ideas. The patient is not nervous/anxious and does not have insomnia.      Objective  Vitals:   05/30/18 1333  BP: 120/68  Pulse: 64  Weight: 146 lb (66.2 kg)  Height: 5\' 3"  (1.6 m)    Physical Exam  Constitutional: No distress.  HENT:  Head: Normocephalic and atraumatic.  Right Ear: External ear normal.  Left Ear: External ear normal.  Nose: Nose normal.  Mouth/Throat: Oropharynx is clear and moist.  Eyes: Pupils are equal, round, and reactive to light. Conjunctivae and EOM are normal. Right eye exhibits no discharge. Left eye exhibits no discharge.  Neck: Normal range of motion. Neck supple. No JVD present. No thyromegaly present.  Cardiovascular: Normal rate, regular rhythm, normal heart sounds and intact distal pulses. Exam reveals no gallop and no friction rub.  No murmur heard. Pulmonary/Chest: Effort normal and breath sounds normal.  Abdominal: Soft. Bowel sounds are normal. She exhibits no mass. There is no tenderness. There is no guarding.  Musculoskeletal: Normal range of motion. She exhibits no edema.  Lymphadenopathy:    She has no cervical adenopathy.  Neurological: She is alert. She has normal reflexes.  Skin: Skin is warm and dry. She is not diaphoretic. There is erythema.  Center dark spot with yellow exudative base and surrounding erythema,      Assessment & Plan  Problem List Items Addressed This Visit    None    Visit Diagnoses    Cellulitis of chest wall    -  Primary   residual tick matter removed from site   Tick bite, initial encounter       cleaned area on R) breast and put triple antibiotic ointment on bite. sent in Doxy and bactroban ointment- apply BID      Meds ordered this encounter  Medications  . doxycycline (VIBRA-TABS) 100 MG tablet    Sig: Take 1 tablet (100 mg total) by mouth 2 (two) times daily.    Dispense:  20 tablet    Refill:  0  . mupirocin ointment (BACTROBAN) 2 %    Sig: Apply 1 application topically 2 (two)  times daily.    Dispense:  22 g    Refill:  0      Dr. [facility]  05/30/18

## 2019-07-25 DIAGNOSIS — H353131 Nonexudative age-related macular degeneration, bilateral, early dry stage: Secondary | ICD-10-CM | POA: Diagnosis not present

## 2019-07-25 DIAGNOSIS — H1859 Other hereditary corneal dystrophies: Secondary | ICD-10-CM | POA: Diagnosis not present

## 2019-07-26 ENCOUNTER — Ambulatory Visit (INDEPENDENT_AMBULATORY_CARE_PROVIDER_SITE_OTHER): Payer: Medicare HMO | Admitting: Family Medicine

## 2019-07-26 ENCOUNTER — Encounter: Payer: Self-pay | Admitting: Family Medicine

## 2019-07-26 ENCOUNTER — Other Ambulatory Visit: Payer: Self-pay

## 2019-07-26 VITALS — BP 130/76 | HR 72 | Ht 63.0 in | Wt 145.0 lb

## 2019-07-26 DIAGNOSIS — L989 Disorder of the skin and subcutaneous tissue, unspecified: Secondary | ICD-10-CM

## 2019-07-26 DIAGNOSIS — R030 Elevated blood-pressure reading, without diagnosis of hypertension: Secondary | ICD-10-CM

## 2019-07-26 NOTE — Patient Instructions (Signed)

## 2019-07-26 NOTE — Progress Notes (Signed)
Date:  07/26/2019   Name:  Kerri Harmon   DOB:  Jul 03, 1931   MRN:  [id number]   Chief Complaint: Hypertension (Eye doctor checked b/p and it was high- told to come here for recheck. She ate bacon and sausage yesterday before appt)  Hypertension This is a chronic problem. The current episode started more than 1 year ago. The problem has been waxing and waning since onset. The problem is controlled. Pertinent negatives include no anxiety, blurred vision, chest pain, headaches, malaise/fatigue, neck pain, orthopnea, palpitations, peripheral edema, PND, shortness of breath or sweats. There are no associated agents to hypertension. Risk factors for coronary artery disease include post-menopausal state. The current treatment provides mild improvement. There is no history of angina, kidney disease, CAD/MI, CVA, heart failure, left ventricular hypertrophy, PVD or retinopathy. There is no history of chronic renal disease, a hypertension causing med or renovascular disease.    Review of Systems  Constitutional: Negative.  Negative for chills, fatigue, fever, malaise/fatigue and unexpected weight change.  HENT: Negative for congestion, ear discharge, ear pain, rhinorrhea, sinus pressure, sneezing and sore throat.   Eyes: Negative for blurred vision, photophobia, pain, discharge, redness and itching.  Respiratory: Negative for cough, shortness of breath, wheezing and stridor.   Cardiovascular: Negative for chest pain, palpitations, orthopnea and PND.  Gastrointestinal: Negative for abdominal pain, blood in stool, constipation, diarrhea, nausea and vomiting.  Endocrine: Negative for cold intolerance, heat intolerance, polydipsia, polyphagia and polyuria.  Genitourinary: Negative for dysuria, flank pain, frequency, hematuria, menstrual problem, pelvic pain, urgency, vaginal bleeding and vaginal discharge.  Musculoskeletal: Negative for arthralgias, back pain, myalgias and neck pain.  Skin: Negative  for rash.  Allergic/Immunologic: Negative for environmental allergies and food allergies.  Neurological: Negative for dizziness, weakness, light-headedness, numbness and headaches.  Hematological: Negative for adenopathy. Does not bruise/bleed easily.  Psychiatric/Behavioral: Negative for dysphoric mood. The patient is not nervous/anxious.     Patient Active Problem List   Diagnosis Date Noted  . Contusion of right lower extremity 03/22/2016    No Known Allergies  Past Surgical History:  Procedure Laterality Date  . ABDOMINAL HYSTERECTOMY    . APPENDECTOMY    . CHOLECYSTECTOMY      Social History   Tobacco Use  . Smoking status: Never Smoker  . Smokeless tobacco: Current User    Types: Snuff  Substance Use Topics  . Alcohol use: No  . Drug use: No     Medication list has been reviewed and updated.  Current Meds  Medication Sig  . mupirocin ointment (BACTROBAN) 2 % Apply 1 application topically 2 (two) times daily.    PHQ 2/9 Scores 07/26/2019 05/30/2018 03/07/2018 05/24/2016  PHQ - 2 Score 0 0 0 0  PHQ- 9 Score 0 0 0 -    BP Readings from Last 3 Encounters:  07/26/19 130/76  05/30/18 120/68  03/07/18 120/80    Physical Exam Vitals signs and nursing note reviewed.  Constitutional:      Appearance: She is well-developed.  HENT:     Head: Normocephalic.     Right Ear: Tympanic membrane, ear canal and external ear normal.     Left Ear: Tympanic membrane, ear canal and external ear normal.     Nose: Nose normal.  Eyes:     General: Lids are everted, no foreign bodies appreciated. No scleral icterus.       Left eye: No foreign body or hordeolum.     Conjunctiva/sclera: Conjunctivae normal.  Right eye: Right conjunctiva is not injected.     Left eye: Left conjunctiva is not injected.     Pupils: Pupils are equal, round, and reactive to light.  Neck:     Musculoskeletal: Normal range of motion and neck supple.     Thyroid: No thyromegaly.     Vascular: No  JVD.     Trachea: No tracheal deviation.  Cardiovascular:     Rate and Rhythm: Normal rate and regular rhythm.     Chest Wall: PMI is not displaced.     Pulses: Normal pulses.     Heart sounds: Normal heart sounds, S1 normal and S2 normal. No murmur. No systolic murmur. No diastolic murmur. No friction rub. No gallop. No S3 or S4 sounds.   Pulmonary:     Effort: Pulmonary effort is normal. No respiratory distress.     Breath sounds: Normal breath sounds. No wheezing, rhonchi or rales.  Abdominal:     General: Bowel sounds are normal.     Palpations: Abdomen is soft. There is no mass.     Tenderness: There is no abdominal tenderness. There is no guarding or rebound.  Musculoskeletal: Normal range of motion.        General: No tenderness.     Right lower leg: No edema.     Left lower leg: No edema.  Lymphadenopathy:     Cervical: No cervical adenopathy.  Skin:    General: Skin is warm.     Findings: Erythema present. No rash.     Comments: Erythema/scaling  Neurological:     Mental Status: She is alert and oriented to person, place, and time.     Cranial Nerves: No cranial nerve deficit.     Deep Tendon Reflexes: Reflexes normal.  Psychiatric:        Mood and Affect: Mood is not anxious or depressed.     Wt Readings from Last 3 Encounters:  07/26/19 145 lb (65.8 kg)  05/30/18 146 lb (66.2 kg)  03/07/18 151 lb (68.5 kg)    BP 130/76   Pulse 72   Ht 5\' 3"  (1.6 m)   Wt 145 lb (65.8 kg)   BMI 25.69 kg/m   Assessment and Plan:  1. Elevated blood pressure reading Patient had an elevated blood pressure reading at an ophthalmology appointment.  The patient admits to having 3 strips of bacon and a cake of sausage.  Needless to say we had a long discussion about sodium in the problems of excess.  Patient seemingly understands and will be more moderate and her sodium intake.  In the meantime her blood pressure today was excellent and we will continue with current dietary approach.   2. Skin lesion This is a new observation of the patient status.  Patient was noted to have a skin area on the right temple area that is suggestive of basal cell carcinoma we have arranged a dermatology appointment for that evaluation.  Patient has been referred to dermatology for evaluation and treatment. - Ambulatory referral to Dermatology

## 2019-08-14 DIAGNOSIS — C44319 Basal cell carcinoma of skin of other parts of face: Secondary | ICD-10-CM | POA: Diagnosis not present

## 2019-08-14 DIAGNOSIS — D485 Neoplasm of uncertain behavior of skin: Secondary | ICD-10-CM | POA: Diagnosis not present

## 2019-08-14 DIAGNOSIS — L578 Other skin changes due to chronic exposure to nonionizing radiation: Secondary | ICD-10-CM | POA: Diagnosis not present

## 2019-09-03 DIAGNOSIS — C44319 Basal cell carcinoma of skin of other parts of face: Secondary | ICD-10-CM | POA: Diagnosis not present

## 2019-09-03 DIAGNOSIS — C4491 Basal cell carcinoma of skin, unspecified: Secondary | ICD-10-CM | POA: Diagnosis not present

## 2020-01-29 ENCOUNTER — Ambulatory Visit (INDEPENDENT_AMBULATORY_CARE_PROVIDER_SITE_OTHER): Payer: Medicare HMO | Admitting: Family Medicine

## 2020-01-29 ENCOUNTER — Other Ambulatory Visit: Payer: Self-pay

## 2020-01-29 ENCOUNTER — Encounter: Payer: Self-pay | Admitting: Family Medicine

## 2020-01-29 VITALS — BP 130/88 | HR 60 | Ht 63.0 in | Wt 149.0 lb

## 2020-01-29 DIAGNOSIS — N643 Galactorrhea not associated with childbirth: Secondary | ICD-10-CM

## 2020-01-29 DIAGNOSIS — Z1231 Encounter for screening mammogram for malignant neoplasm of breast: Secondary | ICD-10-CM

## 2020-01-29 NOTE — Progress Notes (Signed)
Date:  01/29/2020   Name:  Kerri Harmon   DOB:  September 21, 1931   MRN:  [id number]   Chief Complaint: breast exam ("milky" discharge from both nipples- has had it "always")  Patient is a 86 year old female who presents for a breast exam exam. The patient reports the following problems: galactorrhea. Health maintenance has been reviewed need mammogram.   No results found for: CREATININE, BUN, NA, K, CL, CO2 No results found for: CHOL, HDL, LDLCALC, LDLDIRECT, TRIG, CHOLHDL No results found for: TSH No results found for: HGBA1C   Review of Systems  Constitutional: Negative.  Negative for chills, fatigue, fever and unexpected weight change.  HENT: Negative for congestion, ear discharge, ear pain, nosebleeds, rhinorrhea, sinus pressure, sneezing and sore throat.   Eyes: Negative for photophobia, pain, discharge, redness and itching.  Respiratory: Negative for cough, shortness of breath, wheezing and stridor.   Cardiovascular: Negative for chest pain and leg swelling.  Gastrointestinal: Negative for abdominal pain, blood in stool, constipation, diarrhea, nausea and vomiting.  Endocrine: Negative for cold intolerance, heat intolerance, polydipsia, polyphagia and polyuria.  Genitourinary: Negative for dysuria, flank pain, frequency, hematuria, menstrual problem, pelvic pain, urgency, vaginal bleeding and vaginal discharge.  Musculoskeletal: Negative for arthralgias, back pain and myalgias.  Skin: Negative for rash.  Allergic/Immunologic: Negative for environmental allergies and food allergies.  Neurological: Negative for dizziness, weakness, light-headedness, numbness and headaches.  Hematological: Negative for adenopathy. Does not bruise/bleed easily.  Psychiatric/Behavioral: Negative for dysphoric mood. The patient is not nervous/anxious.     Patient Active Problem List   Diagnosis Date Noted  . Contusion of right lower extremity 03/22/2016    No Known Allergies  Past Surgical  History:  Procedure Laterality Date  . ABDOMINAL HYSTERECTOMY    . APPENDECTOMY    . CHOLECYSTECTOMY      Social History   Tobacco Use  . Smoking status: Never Smoker  . Smokeless tobacco: Current User    Types: Snuff  Substance Use Topics  . Alcohol use: No  . Drug use: No     Medication list has been reviewed and updated.  No outpatient medications have been marked as taking for the 01/29/20 encounter (Office Visit) with Juline Patch, MD.    [facility] 2/9 Scores 01/29/2020 07/26/2019 05/30/2018 03/07/2018  PHQ - 2 Score 0 0 0 0  PHQ- 9 Score 0 0 0 0    BP Readings from Last 3 Encounters:  01/29/20 130/88  07/26/19 130/76  05/30/18 120/68    Physical Exam Vitals and nursing note reviewed.  Constitutional:      General: She is not in acute distress.    Appearance: She is not diaphoretic.  HENT:     Head: Normocephalic and atraumatic.     Right Ear: Tympanic membrane, ear canal and external ear normal.     Left Ear: Tympanic membrane, ear canal and external ear normal.     Nose: Nose normal.  Eyes:     General:        Right eye: No discharge.        Left eye: No discharge.     Conjunctiva/sclera: Conjunctivae normal.     Pupils: Pupils are equal, round, and reactive to light.  Neck:     Thyroid: No thyroid mass, thyromegaly or thyroid tenderness.     Vascular: No JVD.  Cardiovascular:     Rate and Rhythm: Normal rate and regular rhythm.     Chest Wall: PMI is  not displaced. No thrill.     Pulses: Normal pulses.     Heart sounds: Normal heart sounds and S1 normal. No murmur. No systolic murmur. No diastolic murmur. No friction rub. No gallop. No S3 or S4 sounds.   Pulmonary:     Effort: Pulmonary effort is normal.     Breath sounds: Normal breath sounds. No decreased breath sounds, wheezing, rhonchi or rales.  Chest:     Breasts:        Right: Normal. No swelling, bleeding, inverted nipple, mass, nipple discharge, skin change or tenderness.        Left: Normal.  No swelling, bleeding, inverted nipple, mass, nipple discharge, skin change or tenderness.  Abdominal:     General: Bowel sounds are normal.     Palpations: Abdomen is soft. There is no mass.     Tenderness: There is no abdominal tenderness. There is no guarding.  Musculoskeletal:        General: Normal range of motion.     Cervical back: Normal range of motion and neck supple.  Lymphadenopathy:     Cervical: No cervical adenopathy.     Right cervical: No superficial, deep or posterior cervical adenopathy.    Left cervical: No superficial, deep or posterior cervical adenopathy.  Skin:    General: Skin is warm and dry.  Neurological:     Mental Status: She is alert.     Deep Tendon Reflexes: Reflexes are normal and symmetric.     Wt Readings from Last 3 Encounters:  01/29/20 149 lb (67.6 kg)  07/26/19 145 lb (65.8 kg)  05/30/18 146 lb (66.2 kg)    BP 130/88   Pulse 60   Ht 5\' 3"  (1.6 m)   Wt 149 lb (67.6 kg)   BMI 26.39 kg/m   Assessment and Plan:   1. Encounter for screening mammogram for breast cancer Patient presents today for manual breast exam for preevaluation of breast prior to bilateral breast tomography. - MM DIAG BREAST TOMO BILATERAL; Future  2. Galactorrhea Patient states that she has had a history of milky discharge from both breasts suggesting a pituitary adenoma.  We will check her prolactin to see if there is an elevation and patient will undergo ultrasounds of both breasts for further evaluation. - Prolactin - MM DIAG BREAST TOMO BILATERAL; Future - US BREAST LTD UNI LEFT INC AXILLA; Future - US BREAST LTD UNI RIGHT INC AXILLA; Future

## 2020-01-30 LAB — PROLACTIN: Prolactin: 11.4 ng/mL (ref 4.8–23.3)

## 2020-01-30 LAB — SPECIMEN STATUS REPORT

## 2020-02-05 ENCOUNTER — Ambulatory Visit
Admission: RE | Admit: 2020-02-05 | Discharge: 2020-02-05 | Disposition: A | Discharge status: Home / Self Care | Payer: Medicare HMO | Source: Ambulatory Visit | Attending: Family Medicine | Admitting: Family Medicine

## 2020-02-05 DIAGNOSIS — Z1231 Encounter for screening mammogram for malignant neoplasm of breast: Secondary | ICD-10-CM | POA: Diagnosis not present

## 2020-02-05 DIAGNOSIS — N643 Galactorrhea not associated with childbirth: Secondary | ICD-10-CM | POA: Diagnosis not present

## 2020-02-05 DIAGNOSIS — R928 Other abnormal and inconclusive findings on diagnostic imaging of breast: Secondary | ICD-10-CM | POA: Diagnosis not present

## 2020-02-05 DIAGNOSIS — N6452 Nipple discharge: Secondary | ICD-10-CM | POA: Diagnosis not present

## 2020-08-04 IMAGING — MG DIGITAL DIAGNOSTIC BILAT W/ TOMO W/ CAD
6 of 12 series · 6 of 36 positions shown · non-contrast
Comparison: Previous exam(s).

CLINICAL DATA: 88-year-old female presenting for evaluation of
bilateral spontaneous milky nipple discharge for the last several
years. The patient has had her prolactin checked recently, which was
normal.Upon further questioning, the patient states that the
discharge is thick, and "stays on the nipple" , and she needs to use
soap and a wash cloth or a Q-tip to remove the discharge.

EXAM:
DIGITAL DIAGNOSTIC BILATERAL MAMMOGRAM WITH CAD AND TOMO
ULTRASOUND BILATERAL BREAST

[L MLO synth-2D (1 of 2)]
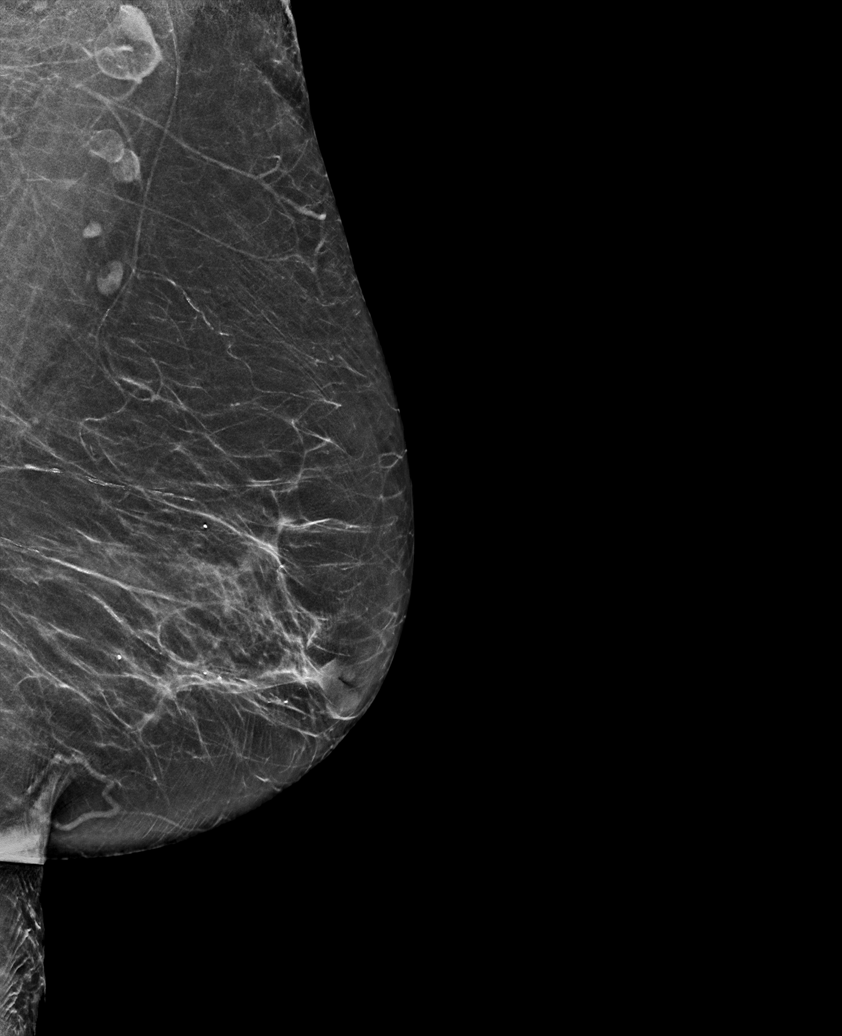

[L CC synth-2D]
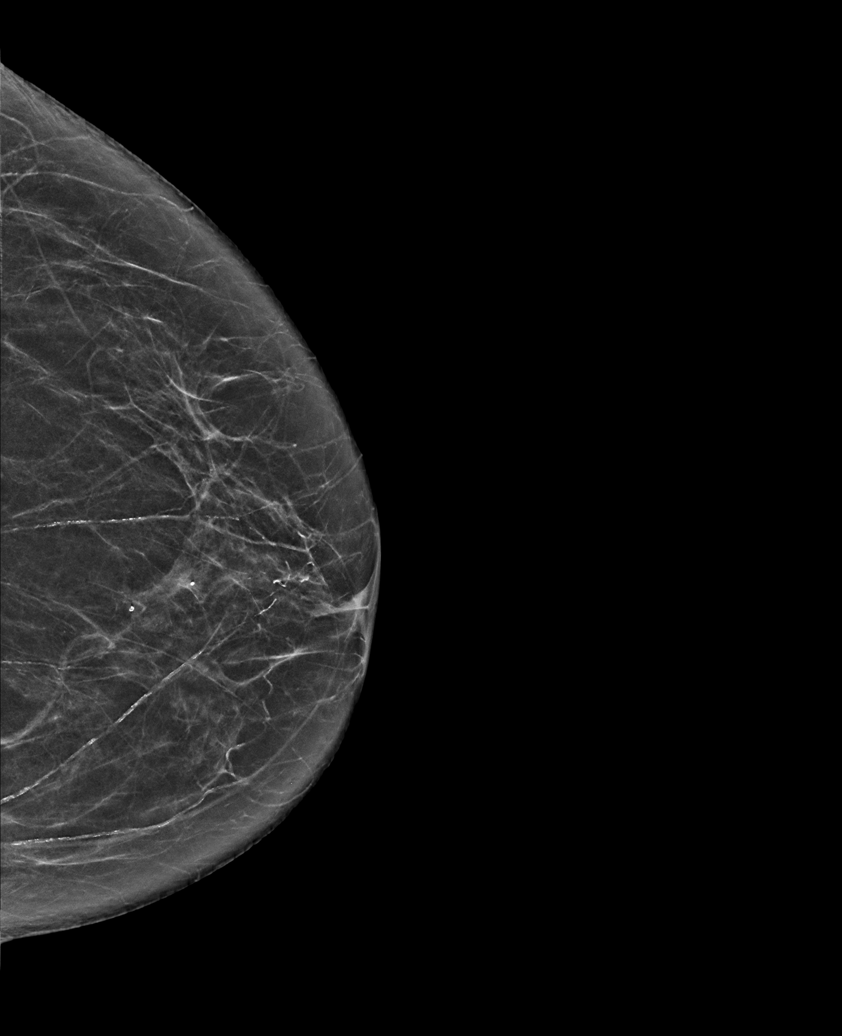

[R CC synth-2D]
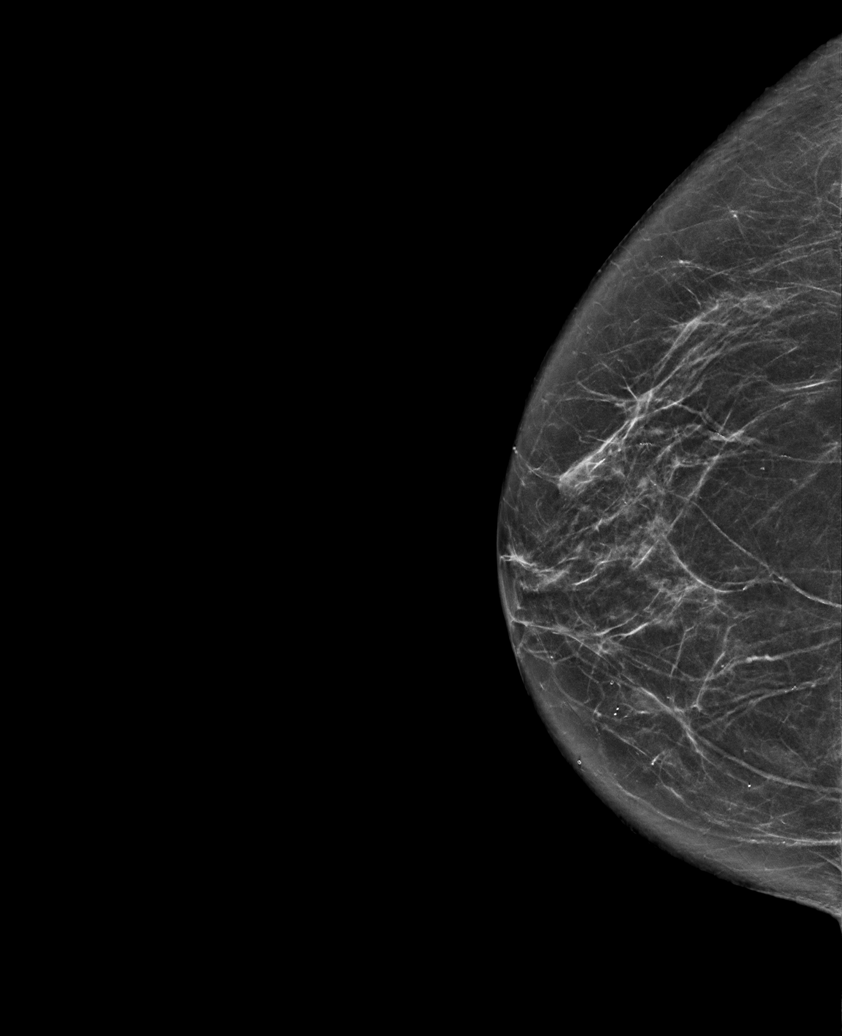

[R MLO synth-2D (1 of 2)]
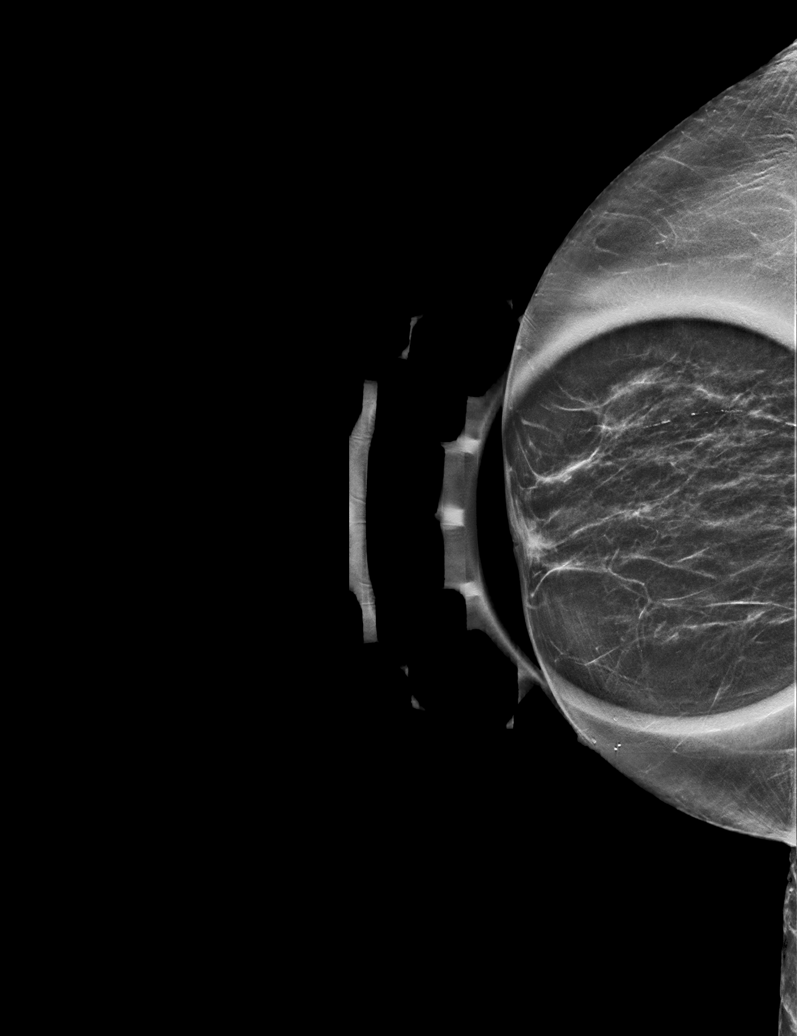

[L MLO synth-2D (2 of 2)]
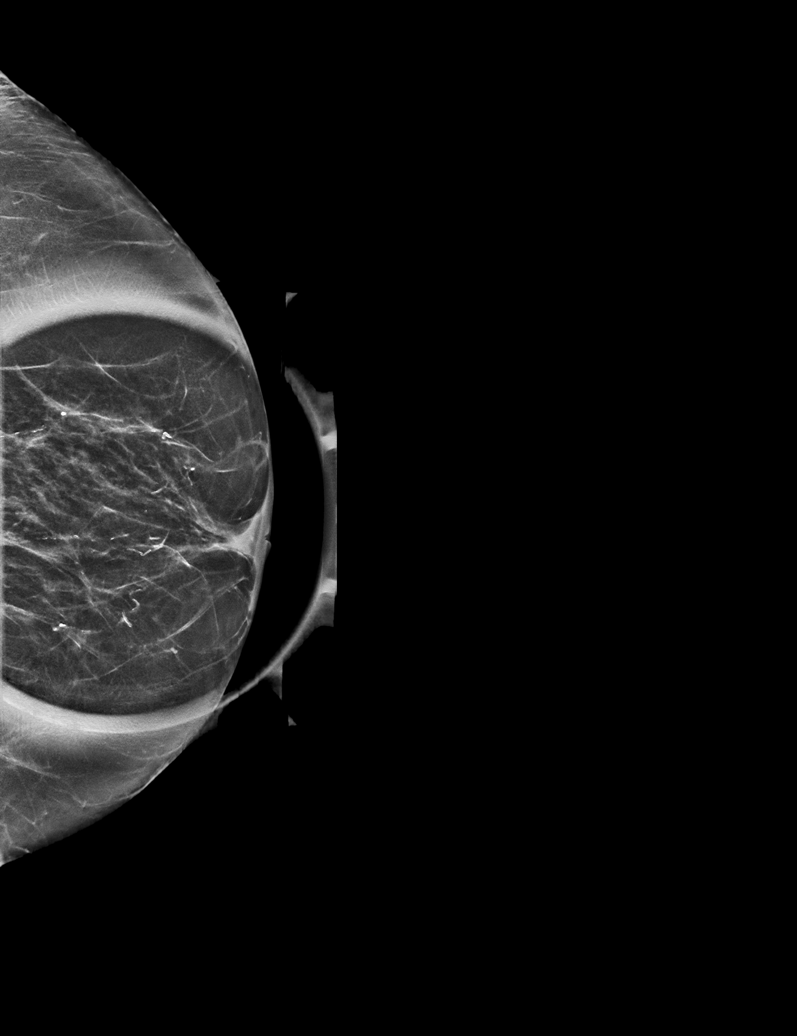

[R MLO synth-2D (2 of 2)]
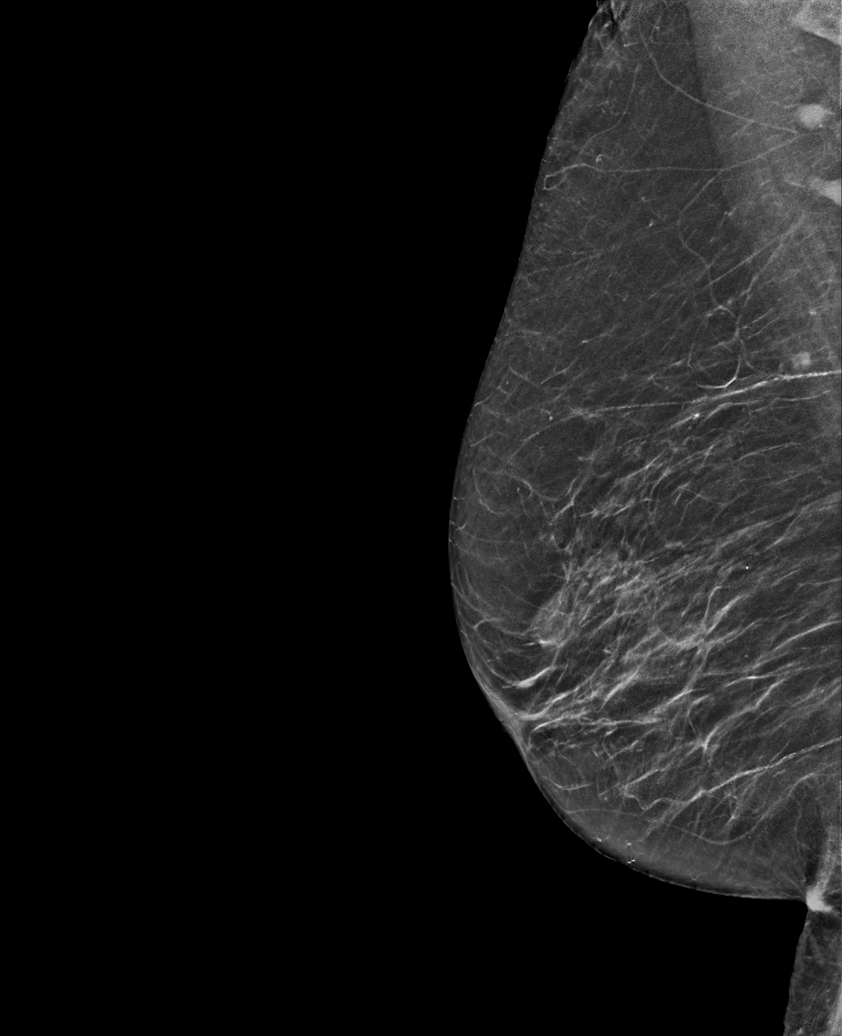

[6 of 36 positions shown; findings below may reference images not displayed]

ACR Breast Density Category b: There are scattered areas of
fibroglandular density.
FINDINGS: No suspicious calcifications, masses or areas of distortion are seen
in the bilateral breasts.

Mammographic images were processed with CAD.

Retroareolar ultrasound of the right and left breast demonstrates
normal fibroglandular tissue. There is a slightly prominent duct in
the retroareolar left breast. No intraductal masses are seen in
either breast.
IMPRESSION: 1. No suspicious mammographic or targeted sonographic findings are
seen in either breast to account for the patient's nipple discharge.
The description of the discharge as bilateral chronic, thick and
white in addition to the normal imaging findings is consistent with
a benign process.

2.  No mammographic evidence of malignancy in the bilateral breasts.

RECOMMENDATION:
1. Clinical follow-up recommended for the bilateral nipple
discharge. Any further workup should be based on clinical grounds.

2.  Screening mammogram in one year.(Code:OF-6-WM9)

I have discussed the findings and recommendations with the patient.
If applicable, a reminder letter will be sent to the patient
regarding the next appointment.

BI-RADS CATEGORY  1: Negative.

## 2020-09-22 ENCOUNTER — Ambulatory Visit (INDEPENDENT_AMBULATORY_CARE_PROVIDER_SITE_OTHER): Payer: Medicare HMO

## 2020-09-22 ENCOUNTER — Telehealth: Payer: Self-pay | Admitting: Family Medicine

## 2020-09-22 DIAGNOSIS — Z0000 Encounter for general adult medical examination without abnormal findings: Secondary | ICD-10-CM

## 2020-09-22 NOTE — Telephone Encounter (Signed)
Patient called to request the doctor to call in a cream for her leg, Nystatin.  She stated that she use to  Take it and the doctor would know.  Please advise and to discuss at [phone]

## 2020-09-22 NOTE — Progress Notes (Signed)
Subjective:   Kerri Harmon is a 86 y.o. female who presents for an Initial Medicare Annual Wellness Visit.  Virtual Visit via Telephone Note  I connected with  Kirt Boys on 09/22/20 at  8:00 AM EDT by telephone and verified that I am speaking with the correct person using two identifiers.  Medicare Annual Wellness visit completed telephonically due to Covid-19 pandemic.   Location: Patient: home Provider: [facility]   I discussed the limitations, risks, security and privacy concerns of performing an evaluation and management service by telephone and the availability of in person appointments. The patient expressed understanding and agreed to proceed.  Unable to perform video visit due to video visit attempted and failed and/or patient does not have video capability.   Some vital signs may be absent or patient reported.   Clemetine Marker, LPN   Review of Systems     Cardiac Risk Factors include: advanced age (>93men, >9 women)     Objective:    There were no vitals filed for this visit. There is no height or weight on file to calculate BMI.  Advanced Directives 09/22/2020  Does Patient Have a Medical Advance Directive? No  Would patient like information on creating a medical advance directive? No - Patient declined    Current Medications (verified) Outpatient Encounter Medications as of 09/22/2020  Medication Sig  . [DISCONTINUED] mupirocin ointment (BACTROBAN) 2 % Apply 1 application topically 2 (two) times daily. (Patient not taking: Reported on 01/29/2020)   No facility-administered encounter medications on file as of 09/22/2020.    Allergies (verified) Patient has no known allergies.   History: Past Medical History:  Diagnosis Date  . Basal cell carcinoma   . Macular degeneration    Past Surgical History:  Procedure Laterality Date  . ABDOMINAL HYSTERECTOMY    . APPENDECTOMY    . BREAST EXCISIONAL BIOPSY Left 07/13/2007   DR. Ely excision of mass,  benign  . CHOLECYSTECTOMY     History reviewed. No pertinent family history. Social History   Socioeconomic History  . Marital status: Widowed    Spouse name: Not on file  . Number of children: Not on file  . Years of education: Not on file  . Highest education level: Not on file  Occupational History  . Not on file  Tobacco Use  . Smoking status: Never Smoker  . Smokeless tobacco: Current User    Types: Snuff  Vaping Use  . Vaping Use: Never used  Substance and Sexual Activity  . Alcohol use: No  . Drug use: No  . Sexual activity: Never  Other Topics Concern  . Not on file  Social History Narrative  . Not on file   Social Determinants of Health   Financial Resource Strain: Low Risk   . Difficulty of Paying Living Expenses: Not very hard  Food Insecurity: No Food Insecurity  . Worried About Charity fundraiser in the Last Year: Never true  . Ran Out of Food in the Last Year: Never true  Transportation Needs: No Transportation Needs  . Lack of Transportation (Medical): No  . Lack of Transportation (Non-Medical): No  Physical Activity: Inactive  . Days of Exercise per Week: 0 days  . Minutes of Exercise per Session: 0 min  Stress: No Stress Concern Present  . Feeling of Stress : Not at all  Social Connections: Unknown  . Frequency of Communication with Friends and Family: Patient refused  . Frequency of Social Gatherings with Friends  and Family: Patient refused  . Attends Religious Services: Patient refused  . Active Member of Clubs or Organizations: Patient refused  . Attends Archivist Meetings: Patient refused  . Marital Status: Widowed    Tobacco Counseling Ready to quit: Not Answered Counseling given: Not Answered   Clinical Intake:  Pre-visit preparation completed: Yes  Pain : No/denies pain     Nutritional Risks: None Diabetes: No  How often do you need to have someone help you when you read instructions, pamphlets, or other written  materials from your doctor or pharmacy?: 1 - Never   Interpreter Needed?: No  Information entered by :: Clemetine Marker LPN   Activities of Daily Living In your present state of health, do you have any difficulty performing the following activities: 09/22/2020  Hearing? Y  Comment declines hearing aids  Vision? N  Difficulty concentrating or making decisions? N  Walking or climbing stairs? N  Dressing or bathing? N  Doing errands, shopping? N  Preparing Food and eating ? N  Using the Toilet? N  In the past six months, have you accidently leaked urine? N  Do you have problems with loss of bowel control? N  Managing your Medications? N  Managing your Finances? N  Housekeeping or managing your Housekeeping? N  Some recent data might be hidden    Patient Care Team: Juline Patch, MD as PCP - General (Family Medicine)  Indicate any recent Medical Services you may have received from other than [facility] providers in the past year (date may be approximate).     Assessment:   This is a routine wellness examination for [place].  Hearing/Vision screen  Hearing Screening   125Hz  250Hz  500Hz  1000Hz  2000Hz  3000Hz  4000Hz  6000Hz  8000Hz   Right ear:           Left ear:           Comments: Pt has poor hearing; declines hearing aids  Vision Screening Comments: Vision screenings at [facility]; due for exam  Dietary issues and exercise activities discussed: Current Exercise Habits: The patient does not participate in regular exercise at present, Exercise limited by: None identified  Goals   None    Depression Screen PHQ 2/9 Scores 09/22/2020 01/29/2020 07/26/2019 05/30/2018 03/07/2018 05/24/2016 03/22/2016  PHQ - 2 Score 0 0 0 0 0 0 0  PHQ- 9 Score - 0 0 0 0 - -    Fall Risk Fall Risk  09/22/2020 01/29/2020 05/30/2018 03/07/2018 05/24/2016  Falls in the past year? 1 1 No No No  Number falls in past yr: 1 0 - - -  Injury with Fall? 0 0 - - -  Risk for fall due to : History of  fall(s);Impaired balance/gait Impaired vision - - -  Follow up Falls prevention discussed Falls evaluation completed;Falls prevention discussed;Education provided - - -     Any stairs in or around the home? No  If so, are there any without handrails? No  Home free of loose throw rugs in walkways, pet beds, electrical cords, etc? Yes  Adequate lighting in your home to reduce risk of falls? Yes   ASSISTIVE DEVICES UTILIZED TO PREVENT FALLS:  Life alert? No  Use of a cane, walker or w/c? No  Grab bars in the bathroom? No  Shower chair or bench in shower? No  Elevated toilet seat or a handicapped toilet? No   TIMED UP AND GO:  Was the test performed? No . telephonic visit.  Cognitive Function: 6CIT deferred due to telephonic visit and pt's poor hearing        Immunizations Immunization History  Administered Date(s) Administered  . Pneumococcal Conjugate-13 05/13/2016  . Tdap 03/19/2016    TDAP status: Up to date   Flu Vaccine status: Declined, Education has been provided regarding the importance of this vaccine but patient still declined. Advised may receive this vaccine at local pharmacy or Health Dept. Aware to provide a copy of the vaccination record if obtained from local pharmacy or Health Dept. Verbalized acceptance and understanding.   Pneumococcal vaccine status: Declined,  Education has been provided regarding the importance of this vaccine but patient still declined. Advised may receive this vaccine at local pharmacy or Health Dept. Aware to provide a copy of the vaccination record if obtained from local pharmacy or Health Dept. Verbalized acceptance and understanding.    Covid-19 vaccine status: Declined, Education has been provided regarding the importance of this vaccine but patient still declined. Advised may receive this vaccine at local pharmacy or Health Dept.or vaccine clinic. Aware to provide a copy of the vaccination record if obtained from local pharmacy or  Health Dept. Verbalized acceptance and understanding.  Qualifies for Shingles Vaccine? Yes   Zostavax completed No   Shingrix Completed?: No.    Education has been provided regarding the importance of this vaccine. Patient has been advised to call insurance company to determine out of pocket expense if they have not yet received this vaccine. Advised may also receive vaccine at local pharmacy or Health Dept. Verbalized acceptance and understanding.  Screening Tests Health Maintenance  Topic Date Due  . COVID-19 Vaccine (1) Never done  . INFLUENZA VACCINE  Never done  . DEXA SCAN  01/28/2021 (Originally 04/12/1998)  . PNA vac Low Risk Adult (2 of 2 - PPSV23) 01/28/2021 (Originally 05/13/2017)  . TETANUS/TDAP  03/19/2026    Health Maintenance  Health Maintenance Due  Topic Date Due  . COVID-19 Vaccine (1) Never done  . INFLUENZA VACCINE  Never done    Colorectal cancer screening: No longer required.    Mammogram status: Completed 02/05/20. Repeat every year    Bone density status: pt declined  Lung Cancer Screening: (Low Dose CT Chest recommended if Age 6-80 years, 30 pack-year currently smoking OR have quit w/in 15years.) does not qualify.   Additional Screening:  Hepatitis C Screening: does not qualify;   Vision Screening: Recommended annual ophthalmology exams for early detection of glaucoma and other disorders of the eye. Is the patient up to date with their annual eye exam?  No  Who is the provider or what is the name of the office in which the patient attends annual eye exams? Dr. Ellin Mayhew  Dental Screening: Recommended annual dental exams for proper oral hygiene  Community Resource Referral / Chronic Care Management: CRR required this visit?  No   CCM required this visit?  No      Plan:     I have personally reviewed and noted the following in the patient's chart:   . Medical and social history . Use of alcohol, tobacco or illicit drugs  . Current medications  and supplements . Functional ability and status . Nutritional status . Physical activity . Advanced directives . List of other physicians . Hospitalizations, surgeries, and ER visits in previous 12 months . Vitals . Screenings to include cognitive, depression, and falls . Referrals and appointments  In addition, I have reviewed and discussed with patient certain preventive protocols, quality  metrics, and best practice recommendations. A written personalized care plan for preventive services as well as general preventive health recommendations were provided to patient.     Clemetine Marker, LPN   17/00/1749   Nurse Notes: pt states she needs refill of nystatin for legs. Pt advised would notify Dr. Ronnald Ramp but would likely need appt to examine.

## 2020-09-22 NOTE — Patient Instructions (Signed)
Kerri Harmon , Thank you for taking time to come for your Medicare Wellness Visit. I appreciate your ongoing commitment to your health goals. Please review the following plan we discussed and let me know if I can assist you in the future.   Screening recommendations/referrals: Colonoscopy: no longer required Mammogram: done 02/05/20 Bone Density: no longer required Recommended yearly ophthalmology/optometry visit for glaucoma screening and checkup Recommended yearly dental visit for hygiene and checkup  Vaccinations: Influenza vaccine: declined Pneumococcal vaccine: done 05/13/16 Tdap vaccine: done 03/19/16 Shingles vaccine: declined   Covid-19:declined  Advanced directives: Advance directive discussed with you today. Even though you declined this today please call our office should you change your mind and we can give you the proper paperwork for you to fill out.  Conditions/risks identified: Recommend drinking 6-8 glasses of water per day  Next appointment: Follow up in one year for your annual wellness visit    Preventive Care 65 Years and Older, Female Preventive care refers to lifestyle choices and visits with your health care provider that can promote health and wellness. What does preventive care include?  A yearly physical exam. This is also called an annual well check.  Dental exams once or twice a year.  Routine eye exams. Ask your health care provider how often you should have your eyes checked.  Personal lifestyle choices, including:  Daily care of your teeth and gums.  Regular physical activity.  Eating a healthy diet.  Avoiding tobacco and drug use.  Limiting alcohol use.  Practicing safe sex.  Taking low-dose aspirin every day.  Taking vitamin and mineral supplements as recommended by your health care provider. What happens during an annual well check? The services and screenings done by your health care provider during your annual well check will depend on  your age, overall health, lifestyle risk factors, and family history of disease. Counseling  Your health care provider may ask you questions about your:  Alcohol use.  Tobacco use.  Drug use.  Emotional well-being.  Home and relationship well-being.  Sexual activity.  Eating habits.  History of falls.  Memory and ability to understand (cognition).  Work and work Statistician.  Reproductive health. Screening  You may have the following tests or measurements:  Height, weight, and BMI.  Blood pressure.  Lipid and cholesterol levels. These may be checked every 5 years, or more frequently if you are over 23 years old.  Skin check.  Lung cancer screening. You may have this screening every year starting at age 74 if you have a 30-pack-year history of smoking and currently smoke or have quit within the past 15 years.  Fecal occult blood test (FOBT) of the stool. You may have this test every year starting at age 39.  Flexible sigmoidoscopy or colonoscopy. You may have a sigmoidoscopy every 5 years or a colonoscopy every 10 years starting at age 23.  Hepatitis C blood test.  Hepatitis B blood test.  Sexually transmitted disease (STD) testing.  Diabetes screening. This is done by checking your blood sugar (glucose) after you have not eaten for a while (fasting). You may have this done every 1-3 years.  Bone density scan. This is done to screen for osteoporosis. You may have this done starting at age 20.  Mammogram. This may be done every 1-2 years. Talk to your health care provider about how often you should have regular mammograms. Talk with your health care provider about your test results, treatment options, and if necessary, the need  for more tests. Vaccines  Your health care provider may recommend certain vaccines, such as:  Influenza vaccine. This is recommended every year.  Tetanus, diphtheria, and acellular pertussis (Tdap, Td) vaccine. You may need a Td booster  every 10 years.  Zoster vaccine. You may need this after age 37.  Pneumococcal 13-valent conjugate (PCV13) vaccine. One dose is recommended after age 74.  Pneumococcal polysaccharide (PPSV23) vaccine. One dose is recommended after age 71. Talk to your health care provider about which screenings and vaccines you need and how often you need them. This information is not intended to replace advice given to you by your health care provider. Make sure you discuss any questions you have with your health care provider. Document Released: 12/19/2015 Document Revised: 08/11/2016 Document Reviewed: 09/23/2015 Elsevier Interactive Patient Education  2017 [place] Prevention in the Home Falls can cause injuries. They can happen to people of all ages. There are many things you can do to make your home safe and to help prevent falls. What can I do on the outside of my home?  Regularly fix the edges of walkways and driveways and fix any cracks.  Remove anything that might make you trip as you walk through a door, such as a raised step or threshold.  Trim any bushes or trees on the path to your home.  Use bright outdoor lighting.  Clear any walking paths of anything that might make someone trip, such as rocks or tools.  Regularly check to see if handrails are loose or broken. Make sure that both sides of any steps have handrails.  Any raised decks and porches should have guardrails on the edges.  Have any leaves, snow, or ice cleared regularly.  Use sand or salt on walking paths during winter.  Clean up any spills in your garage right away. This includes oil or grease spills. What can I do in the bathroom?  Use night lights.  Install grab bars by the toilet and in the tub and shower. Do not use towel bars as grab bars.  Use non-skid mats or decals in the tub or shower.  If you need to sit down in the shower, use a plastic, non-slip stool.  Keep the floor dry. Clean up any  water that spills on the floor as soon as it happens.  Remove soap buildup in the tub or shower regularly.  Attach bath mats securely with double-sided non-slip rug tape.  Do not have throw rugs and other things on the floor that can make you trip. What can I do in the bedroom?  Use night lights.  Make sure that you have a light by your bed that is easy to reach.  Do not use any sheets or blankets that are too big for your bed. They should not hang down onto the floor.  Have a firm chair that has side arms. You can use this for support while you get dressed.  Do not have throw rugs and other things on the floor that can make you trip. What can I do in the kitchen?  Clean up any spills right away.  Avoid walking on wet floors.  Keep items that you use a lot in easy-to-reach places.  If you need to reach something above you, use a strong step stool that has a grab bar.  Keep electrical cords out of the way.  Do not use floor polish or wax that makes floors slippery. If you must use wax, use  non-skid floor wax.  Do not have throw rugs and other things on the floor that can make you trip. What can I do with my stairs?  Do not leave any items on the stairs.  Make sure that there are handrails on both sides of the stairs and use them. Fix handrails that are broken or loose. Make sure that handrails are as long as the stairways.  Check any carpeting to make sure that it is firmly attached to the stairs. Fix any carpet that is loose or worn.  Avoid having throw rugs at the top or bottom of the stairs. If you do have throw rugs, attach them to the floor with carpet tape.  Make sure that you have a light switch at the top of the stairs and the bottom of the stairs. If you do not have them, ask someone to add them for you. What else can I do to help prevent falls?  Wear shoes that:  Do not have high heels.  Have rubber bottoms.  Are comfortable and fit you well.  Are closed  at the toe. Do not wear sandals.  If you use a stepladder:  Make sure that it is fully opened. Do not climb a closed stepladder.  Make sure that both sides of the stepladder are locked into place.  Ask someone to hold it for you, if possible.  Clearly mark and make sure that you can see:  Any grab bars or handrails.  First and last steps.  Where the edge of each step is.  Use tools that help you move around (mobility aids) if they are needed. These include:  Canes.  Walkers.  Scooters.  Crutches.  Turn on the lights when you go into a dark area. Replace any light bulbs as soon as they burn out.  Set up your furniture so you have a clear path. Avoid moving your furniture around.  If any of your floors are uneven, fix them.  If there are any pets around you, be aware of where they are.  Review your medicines with your doctor. Some medicines can make you feel dizzy. This can increase your chance of falling. Ask your doctor what other things that you can do to help prevent falls. This information is not intended to replace advice given to you by your health care provider. Make sure you discuss any questions you have with your health care provider. Document Released: 09/18/2009 Document Revised: 04/29/2016 Document Reviewed: 12/27/2014 Elsevier Interactive Patient Education  2017 [employer].

## 2020-09-25 ENCOUNTER — Other Ambulatory Visit: Payer: Self-pay

## 2020-09-25 ENCOUNTER — Ambulatory Visit (INDEPENDENT_AMBULATORY_CARE_PROVIDER_SITE_OTHER): Payer: Medicare HMO | Admitting: Family Medicine

## 2020-09-25 ENCOUNTER — Encounter: Payer: Self-pay | Admitting: Family Medicine

## 2020-09-25 VITALS — BP 124/80 | HR 64 | Ht 63.0 in | Wt 145.0 lb

## 2020-09-25 DIAGNOSIS — B372 Candidiasis of skin and nail: Secondary | ICD-10-CM | POA: Diagnosis not present

## 2020-09-25 MED ORDER — NYSTATIN 100000 UNIT/GM EX CREA: 1.0000 "application " | TOPICAL_CREAM | Freq: Two times a day (BID) | CUTANEOUS | 4 refills | Status: AC

## 2020-09-25 NOTE — Progress Notes (Signed)
Date:  09/25/2020   Name:  Kerri Harmon   DOB:  [birth year]   MRN:  [id number]   Chief Complaint: yeast under belly (uses Nystatin cream under skin folds- needs refill)  Rash This is a chronic problem. The current episode started more than 1 year ago. The problem has been waxing and waning since onset. Location: skin folds. The rash is characterized by redness. Pertinent negatives include no anorexia, congestion, cough, diarrhea, eye pain, facial edema, fatigue, fever, joint pain, nail changes, rhinorrhea, shortness of breath, sore throat or vomiting.    No results found for: CREATININE, BUN, NA, K, CL, CO2 No results found for: CHOL, HDL, LDLCALC, LDLDIRECT, TRIG, CHOLHDL No results found for: TSH No results found for: HGBA1C No results found for: WBC, HGB, HCT, MCV, PLT No results found for: ALT, AST, GGT, ALKPHOS, BILITOT   Review of Systems  Constitutional: Negative.  Negative for chills, fatigue, fever and unexpected weight change.  HENT: Negative for congestion, ear discharge, ear pain, rhinorrhea, sinus pressure, sneezing and sore throat.   Eyes: Negative for photophobia, pain, discharge, redness and itching.  Respiratory: Negative for cough, shortness of breath, wheezing and stridor.   Gastrointestinal: Negative for abdominal pain, anorexia, blood in stool, constipation, diarrhea, nausea and vomiting.  Endocrine: Negative for cold intolerance, heat intolerance, polydipsia, polyphagia and polyuria.  Genitourinary: Negative for dysuria, flank pain, frequency, hematuria, menstrual problem, pelvic pain, urgency, vaginal bleeding and vaginal discharge.  Musculoskeletal: Negative for arthralgias, back pain, joint pain and myalgias.  Skin: Positive for rash. Negative for nail changes.  Allergic/Immunologic: Negative for environmental allergies and food allergies.  Neurological: Negative for dizziness, weakness, light-headedness, numbness and headaches.  Hematological: Negative  for adenopathy. Does not bruise/bleed easily.  Psychiatric/Behavioral: Negative for dysphoric mood. The patient is not nervous/anxious.     Patient Active Problem List   Diagnosis Date Noted  . Contusion of right lower extremity 03/22/2016    No Known Allergies  Past Surgical History:  Procedure Laterality Date  . ABDOMINAL HYSTERECTOMY    . APPENDECTOMY    . BREAST EXCISIONAL BIOPSY Left 07/13/2007   DR. Ely excision of mass, benign  . CHOLECYSTECTOMY      Social History   Tobacco Use  . Smoking status: Never Smoker  . Smokeless tobacco: Current User    Types: Snuff  Vaping Use  . Vaping Use: Never used  Substance Use Topics  . Alcohol use: No  . Drug use: No     Medication list has been reviewed and updated.  Current Meds  Medication Sig  . nystatin cream (MYCOSTATIN) Apply 1 application topically 2 (two) times daily.    PHQ 2/9 Scores 09/22/2020 01/29/2020 07/26/2019 05/30/2018  PHQ - 2 Score 0 0 0 0  PHQ- 9 Score - 0 0 0    GAD 7 : Generalized Anxiety Score 01/29/2020  Nervous, Anxious, on Edge 0  Control/stop worrying 0  Worry too much - different things 0  Trouble relaxing 0  Restless 0  Easily annoyed or irritable 0  Afraid - awful might happen 0  Total GAD 7 Score 0    BP Readings from Last 3 Encounters:  09/25/20 124/80  01/29/20 130/88  07/26/19 130/76    Physical Exam Vitals and nursing note reviewed.  Constitutional:      Appearance: She is well-developed.  HENT:     Head: Normocephalic.     Right Ear: External ear normal.     Left Ear: External  ear normal.  Eyes:     General: Lids are everted, no foreign bodies appreciated. No scleral icterus.       Left eye: No foreign body or hordeolum.     Conjunctiva/sclera: Conjunctivae normal.     Right eye: Right conjunctiva is not injected.     Left eye: Left conjunctiva is not injected.     Pupils: Pupils are equal, round, and reactive to light.  Neck:     Thyroid: No thyromegaly.      Vascular: No JVD.     Trachea: No tracheal deviation.  Cardiovascular:     Rate and Rhythm: Normal rate and regular rhythm.     Heart sounds: Normal heart sounds. No murmur heard.  No friction rub. No gallop.   Pulmonary:     Effort: Pulmonary effort is normal. No respiratory distress.     Breath sounds: Normal breath sounds. No wheezing or rales.  Abdominal:     General: Bowel sounds are normal.     Palpations: Abdomen is soft. There is no mass.     Tenderness: There is no abdominal tenderness. There is no guarding or rebound.  Musculoskeletal:        General: No tenderness. Normal range of motion.     Cervical back: Normal range of motion and neck supple.  Lymphadenopathy:     Cervical: No cervical adenopathy.  Skin:    General: Skin is warm.     Findings: No rash.  Neurological:     Mental Status: She is alert and oriented to person, place, and time.     Cranial Nerves: No cranial nerve deficit.     Deep Tendon Reflexes: Reflexes normal.  Psychiatric:        Mood and Affect: Mood is not anxious or depressed.     Wt Readings from Last 3 Encounters:  09/25/20 145 lb (65.8 kg)  01/29/20 149 lb (67.6 kg)  07/26/19 145 lb (65.8 kg)    BP 124/80   Pulse 64   Ht 5\' 3"  (1.6 m)   Wt 145 lb (65.8 kg)   BMI 25.69 kg/m   Assessment and Plan: 1. Candidiasis of skin Chronic.  Recurrent.  Episodic.  Patient has rash likely candidiasis in nature involving skin folds in the pannus area.  Will apply twice a day nystatin cream as needed.

## 2020-09-26 ENCOUNTER — Encounter: Payer: Self-pay | Admitting: Family Medicine

## 2021-09-29 ENCOUNTER — Telehealth: Payer: Self-pay | Admitting: Family Medicine

## 2021-09-29 NOTE — Telephone Encounter (Signed)
Copied from [place] [id number]. Topic: Medicare AWV >> Sep 29, 2021  3:24 PM Cher Nakai R wrote: Reason for CRM:  No answer unable to leave a message for patient to call back and schedule Medicare Annual Wellness Visit (AWV) in office and OV with Provider  If unable to come into the office for AWV,  please offer to do virtually or by telephone.  Last AWV: 09/22/2020  Please schedule at anytime with [facility] Health Advisor.  40 minute appointment  Any questions, please contact me at [phone]

## 2022-10-01 DIAGNOSIS — Z1389 Encounter for screening for other disorder: Secondary | ICD-10-CM | POA: Diagnosis not present

## 2022-10-01 DIAGNOSIS — Z7189 Other specified counseling: Secondary | ICD-10-CM | POA: Diagnosis not present

## 2022-10-01 DIAGNOSIS — Z719 Counseling, unspecified: Secondary | ICD-10-CM | POA: Diagnosis not present

## 2022-10-01 DIAGNOSIS — Z13220 Encounter for screening for lipoid disorders: Secondary | ICD-10-CM | POA: Diagnosis not present

## 2022-10-14 DIAGNOSIS — Z23 Encounter for immunization: Secondary | ICD-10-CM | POA: Diagnosis not present

## 2022-10-14 DIAGNOSIS — Z9181 History of falling: Secondary | ICD-10-CM | POA: Diagnosis not present

## 2022-10-14 DIAGNOSIS — H9193 Unspecified hearing loss, bilateral: Secondary | ICD-10-CM | POA: Diagnosis not present

## 2022-10-14 DIAGNOSIS — L989 Disorder of the skin and subcutaneous tissue, unspecified: Secondary | ICD-10-CM | POA: Diagnosis not present

## 2022-10-14 DIAGNOSIS — Z0000 Encounter for general adult medical examination without abnormal findings: Secondary | ICD-10-CM | POA: Diagnosis not present

## 2022-10-14 DIAGNOSIS — Z1389 Encounter for screening for other disorder: Secondary | ICD-10-CM | POA: Diagnosis not present

## 2023-03-31 ENCOUNTER — Other Ambulatory Visit: Payer: Self-pay | Admitting: Physician Assistant

## 2023-03-31 ENCOUNTER — Ambulatory Visit
Admission: RE | Admit: 2023-03-31 | Discharge: 2023-03-31 | Disposition: A | Discharge status: Home / Self Care | Payer: 59 | Source: Ambulatory Visit | Attending: Physician Assistant | Admitting: Physician Assistant

## 2023-03-31 DIAGNOSIS — M7989 Other specified soft tissue disorders: Secondary | ICD-10-CM

## 2023-03-31 DIAGNOSIS — R0609 Other forms of dyspnea: Secondary | ICD-10-CM

## 2023-03-31 DIAGNOSIS — M21611 Bunion of right foot: Secondary | ICD-10-CM | POA: Diagnosis not present

## 2023-03-31 DIAGNOSIS — M21612 Bunion of left foot: Secondary | ICD-10-CM | POA: Diagnosis not present

## 2023-03-31 DIAGNOSIS — N1831 Chronic kidney disease, stage 3a: Secondary | ICD-10-CM | POA: Diagnosis not present

## 2023-03-31 DIAGNOSIS — R0602 Shortness of breath: Secondary | ICD-10-CM | POA: Diagnosis not present

## 2023-03-31 DIAGNOSIS — J9811 Atelectasis: Secondary | ICD-10-CM | POA: Diagnosis not present

## 2023-04-26 ENCOUNTER — Encounter: Payer: Self-pay | Admitting: Pulmonary Disease

## 2023-04-27 ENCOUNTER — Encounter: Payer: Self-pay | Admitting: Podiatry

## 2023-04-27 ENCOUNTER — Ambulatory Visit (INDEPENDENT_AMBULATORY_CARE_PROVIDER_SITE_OTHER): Payer: 59 | Admitting: Podiatry

## 2023-04-27 DIAGNOSIS — M79675 Pain in left toe(s): Secondary | ICD-10-CM

## 2023-04-27 DIAGNOSIS — B351 Tinea unguium: Secondary | ICD-10-CM

## 2023-04-27 DIAGNOSIS — M21611 Bunion of right foot: Secondary | ICD-10-CM

## 2023-04-27 DIAGNOSIS — M79674 Pain in right toe(s): Secondary | ICD-10-CM | POA: Diagnosis not present

## 2023-04-27 DIAGNOSIS — M2011 Hallux valgus (acquired), right foot: Secondary | ICD-10-CM

## 2023-04-27 DIAGNOSIS — L84 Corns and callosities: Secondary | ICD-10-CM

## 2023-04-27 NOTE — Progress Notes (Signed)
  Subjective:  Patient ID: Kerri Harmon, female    DOB: 1931/11/15,  MRN: [id number]  Chief Complaint  Patient presents with   Bunions    New Patient bilateral bunions -    Hammer Toe    left 2nd toe crosses over the big toe    Callouses    2nd left medial, between toes   Nail Problem    Thick toenails that she cannot cut herself    89 y.o. female presents with the above complaint. History confirmed with patient.   Objective:  Physical Exam: warm, good capillary refill, no trophic changes or ulcerative lesions, normal DP and PT pulses, and normal sensory exam. Left Foot: dystrophic yellowed discolored nail plates with subungual debris Right Foot: dystrophic yellowed discolored nail plates with subungual debris and hallux valgus deformity with callus on the lateral hallux where it abuts the second toe   Assessment:   1. Callus of foot   2. Hallux valgus with bunions, right   3. Pain due to onychomycosis of toenails of both feet      Plan:  Patient was evaluated and treated and all questions answered.   Discussed the etiology and treatment options for the condition in detail with the patient. Educated patient on the topical and oral treatment options for mycotic nails. Recommended debridement of the nails today. Sharp and mechanical debridement performed of all painful and mycotic nails today. Nails debrided in length and thickness using a nail nipper to level of comfort. Discussed treatment options including appropriate shoe gear. Follow up as needed for painful nails.  We discussed that the calluses forming from the hallux abutting the second toe from her bunion deformity.  Do not think these are reconstructable at her age.  Callus was debrided of hyperkeratotic tissue.  Offloading silicone pad dispensed and I recommended using these with nonoperative treatment.  Return to see me for both issues as needed.  Return if symptoms worsen or fail to improve.

## 2023-05-17 DIAGNOSIS — G5600 Carpal tunnel syndrome, unspecified upper limb: Secondary | ICD-10-CM | POA: Diagnosis not present

## 2023-09-02 DIAGNOSIS — M25521 Pain in right elbow: Secondary | ICD-10-CM | POA: Diagnosis not present

## 2023-09-02 DIAGNOSIS — M71121 Other infective bursitis, right elbow: Secondary | ICD-10-CM | POA: Diagnosis not present

## 2023-11-03 DIAGNOSIS — R918 Other nonspecific abnormal finding of lung field: Secondary | ICD-10-CM | POA: Diagnosis not present

## 2023-11-03 DIAGNOSIS — S299XXA Unspecified injury of thorax, initial encounter: Secondary | ICD-10-CM | POA: Diagnosis not present

## 2023-11-03 DIAGNOSIS — S32048A Other fracture of fourth lumbar vertebra, initial encounter for closed fracture: Secondary | ICD-10-CM | POA: Diagnosis not present

## 2023-11-03 DIAGNOSIS — R0902 Hypoxemia: Secondary | ICD-10-CM | POA: Diagnosis not present

## 2023-11-03 DIAGNOSIS — S3991XA Unspecified injury of abdomen, initial encounter: Secondary | ICD-10-CM | POA: Diagnosis not present

## 2023-11-03 DIAGNOSIS — I440 Atrioventricular block, first degree: Secondary | ICD-10-CM | POA: Diagnosis not present

## 2023-11-03 DIAGNOSIS — S99912A Unspecified injury of left ankle, initial encounter: Secondary | ICD-10-CM | POA: Diagnosis not present

## 2023-11-03 DIAGNOSIS — M51369 Other intervertebral disc degeneration, lumbar region without mention of lumbar back pain or lower extremity pain: Secondary | ICD-10-CM | POA: Diagnosis not present

## 2023-11-03 DIAGNOSIS — H9190 Unspecified hearing loss, unspecified ear: Secondary | ICD-10-CM | POA: Diagnosis not present

## 2023-11-03 DIAGNOSIS — S32039A Unspecified fracture of third lumbar vertebra, initial encounter for closed fracture: Secondary | ICD-10-CM | POA: Diagnosis not present

## 2023-11-03 DIAGNOSIS — I083 Combined rheumatic disorders of mitral, aortic and tricuspid valves: Secondary | ICD-10-CM | POA: Diagnosis not present

## 2023-11-03 DIAGNOSIS — R0789 Other chest pain: Secondary | ICD-10-CM | POA: Diagnosis not present

## 2023-11-03 DIAGNOSIS — Z0389 Encounter for observation for other suspected diseases and conditions ruled out: Secondary | ICD-10-CM | POA: Diagnosis not present

## 2023-11-03 DIAGNOSIS — I4891 Unspecified atrial fibrillation: Secondary | ICD-10-CM | POA: Diagnosis not present

## 2023-11-03 DIAGNOSIS — R9431 Abnormal electrocardiogram [ECG] [EKG]: Secondary | ICD-10-CM | POA: Diagnosis not present

## 2023-11-03 DIAGNOSIS — J9811 Atelectasis: Secondary | ICD-10-CM | POA: Diagnosis not present

## 2023-11-03 DIAGNOSIS — E049 Nontoxic goiter, unspecified: Secondary | ICD-10-CM | POA: Diagnosis not present

## 2023-11-03 DIAGNOSIS — Z23 Encounter for immunization: Secondary | ICD-10-CM | POA: Diagnosis not present

## 2023-11-03 DIAGNOSIS — L304 Erythema intertrigo: Secondary | ICD-10-CM | POA: Diagnosis not present

## 2023-11-03 DIAGNOSIS — N049 Nephrotic syndrome with unspecified morphologic changes: Secondary | ICD-10-CM | POA: Diagnosis not present

## 2023-11-03 DIAGNOSIS — S32028A Other fracture of second lumbar vertebra, initial encounter for closed fracture: Secondary | ICD-10-CM | POA: Diagnosis not present

## 2023-11-03 DIAGNOSIS — S8992XA Unspecified injury of left lower leg, initial encounter: Secondary | ICD-10-CM | POA: Diagnosis not present

## 2023-11-03 DIAGNOSIS — K429 Umbilical hernia without obstruction or gangrene: Secondary | ICD-10-CM | POA: Diagnosis not present

## 2023-11-03 DIAGNOSIS — S32038A Other fracture of third lumbar vertebra, initial encounter for closed fracture: Secondary | ICD-10-CM | POA: Diagnosis not present

## 2023-11-03 DIAGNOSIS — J90 Pleural effusion, not elsewhere classified: Secondary | ICD-10-CM | POA: Diagnosis not present

## 2023-11-03 DIAGNOSIS — S2220XA Unspecified fracture of sternum, initial encounter for closed fracture: Secondary | ICD-10-CM | POA: Diagnosis not present

## 2023-11-03 DIAGNOSIS — S32019A Unspecified fracture of first lumbar vertebra, initial encounter for closed fracture: Secondary | ICD-10-CM | POA: Diagnosis not present

## 2023-11-03 DIAGNOSIS — S32049A Unspecified fracture of fourth lumbar vertebra, initial encounter for closed fracture: Secondary | ICD-10-CM | POA: Diagnosis not present

## 2023-11-03 DIAGNOSIS — Z7982 Long term (current) use of aspirin: Secondary | ICD-10-CM | POA: Diagnosis not present

## 2023-11-03 DIAGNOSIS — S32029A Unspecified fracture of second lumbar vertebra, initial encounter for closed fracture: Secondary | ICD-10-CM | POA: Diagnosis not present

## 2023-11-03 DIAGNOSIS — S3210XA Unspecified fracture of sacrum, initial encounter for closed fracture: Secondary | ICD-10-CM | POA: Diagnosis not present

## 2023-11-03 DIAGNOSIS — Z9049 Acquired absence of other specified parts of digestive tract: Secondary | ICD-10-CM | POA: Diagnosis not present

## 2023-11-03 DIAGNOSIS — S0990XA Unspecified injury of head, initial encounter: Secondary | ICD-10-CM | POA: Diagnosis not present

## 2023-11-03 DIAGNOSIS — S199XXA Unspecified injury of neck, initial encounter: Secondary | ICD-10-CM | POA: Diagnosis not present

## 2023-11-03 DIAGNOSIS — J984 Other disorders of lung: Secondary | ICD-10-CM | POA: Diagnosis not present

## 2023-11-04 DIAGNOSIS — I083 Combined rheumatic disorders of mitral, aortic and tricuspid valves: Secondary | ICD-10-CM | POA: Diagnosis not present

## 2023-11-04 DIAGNOSIS — S32019A Unspecified fracture of first lumbar vertebra, initial encounter for closed fracture: Secondary | ICD-10-CM | POA: Diagnosis not present

## 2023-11-04 DIAGNOSIS — I440 Atrioventricular block, first degree: Secondary | ICD-10-CM | POA: Diagnosis not present

## 2023-11-04 DIAGNOSIS — S8992XA Unspecified injury of left lower leg, initial encounter: Secondary | ICD-10-CM | POA: Diagnosis not present

## 2023-11-04 DIAGNOSIS — S3210XA Unspecified fracture of sacrum, initial encounter for closed fracture: Secondary | ICD-10-CM | POA: Diagnosis not present

## 2023-11-04 DIAGNOSIS — Z0389 Encounter for observation for other suspected diseases and conditions ruled out: Secondary | ICD-10-CM | POA: Diagnosis not present

## 2023-11-04 DIAGNOSIS — S99912A Unspecified injury of left ankle, initial encounter: Secondary | ICD-10-CM | POA: Diagnosis not present

## 2023-11-04 DIAGNOSIS — S299XXA Unspecified injury of thorax, initial encounter: Secondary | ICD-10-CM | POA: Diagnosis not present

## 2023-11-04 DIAGNOSIS — S199XXA Unspecified injury of neck, initial encounter: Secondary | ICD-10-CM | POA: Diagnosis not present

## 2023-11-04 DIAGNOSIS — S3991XA Unspecified injury of abdomen, initial encounter: Secondary | ICD-10-CM | POA: Diagnosis not present

## 2023-11-04 DIAGNOSIS — R9431 Abnormal electrocardiogram [ECG] [EKG]: Secondary | ICD-10-CM | POA: Diagnosis not present

## 2023-11-04 DIAGNOSIS — S0990XA Unspecified injury of head, initial encounter: Secondary | ICD-10-CM | POA: Diagnosis not present

## 2023-11-05 DIAGNOSIS — M51369 Other intervertebral disc degeneration, lumbar region without mention of lumbar back pain or lower extremity pain: Secondary | ICD-10-CM | POA: Diagnosis not present

## 2023-11-05 DIAGNOSIS — S3210XA Unspecified fracture of sacrum, initial encounter for closed fracture: Secondary | ICD-10-CM | POA: Diagnosis not present

## 2023-11-15 DIAGNOSIS — R9431 Abnormal electrocardiogram [ECG] [EKG]: Secondary | ICD-10-CM | POA: Diagnosis not present

## 2023-11-15 DIAGNOSIS — I440 Atrioventricular block, first degree: Secondary | ICD-10-CM | POA: Diagnosis not present

## 2023-11-15 DIAGNOSIS — I10 Essential (primary) hypertension: Secondary | ICD-10-CM | POA: Diagnosis not present

## 2023-11-15 DIAGNOSIS — Z7901 Long term (current) use of anticoagulants: Secondary | ICD-10-CM | POA: Diagnosis not present

## 2023-11-15 DIAGNOSIS — I480 Paroxysmal atrial fibrillation: Secondary | ICD-10-CM | POA: Diagnosis not present

## 2023-11-27 DIAGNOSIS — I4891 Unspecified atrial fibrillation: Secondary | ICD-10-CM | POA: Diagnosis not present

## 2023-11-28 DIAGNOSIS — R000 Tachycardia, unspecified: Secondary | ICD-10-CM | POA: Diagnosis not present

## 2023-11-28 DIAGNOSIS — I471 Supraventricular tachycardia, unspecified: Secondary | ICD-10-CM | POA: Diagnosis not present

## 2023-12-10 DIAGNOSIS — M7989 Other specified soft tissue disorders: Secondary | ICD-10-CM | POA: Diagnosis not present

## 2023-12-29 DIAGNOSIS — Z1389 Encounter for screening for other disorder: Secondary | ICD-10-CM | POA: Diagnosis not present

## 2023-12-29 DIAGNOSIS — M7989 Other specified soft tissue disorders: Secondary | ICD-10-CM | POA: Diagnosis not present

## 2024-01-05 DIAGNOSIS — M7989 Other specified soft tissue disorders: Secondary | ICD-10-CM | POA: Diagnosis not present

## 2024-01-05 DIAGNOSIS — Z1389 Encounter for screening for other disorder: Secondary | ICD-10-CM | POA: Diagnosis not present

## 2024-01-10 DIAGNOSIS — Z1389 Encounter for screening for other disorder: Secondary | ICD-10-CM | POA: Diagnosis not present

## 2024-01-10 DIAGNOSIS — M7989 Other specified soft tissue disorders: Secondary | ICD-10-CM | POA: Diagnosis not present

## 2024-03-29 DIAGNOSIS — L989 Disorder of the skin and subcutaneous tissue, unspecified: Secondary | ICD-10-CM | POA: Diagnosis not present

## 2024-04-04 ENCOUNTER — Encounter (INDEPENDENT_AMBULATORY_CARE_PROVIDER_SITE_OTHER): Payer: Self-pay | Admitting: Nurse Practitioner

## 2024-04-16 DIAGNOSIS — Z1389 Encounter for screening for other disorder: Secondary | ICD-10-CM | POA: Diagnosis not present

## 2024-04-16 DIAGNOSIS — Z0000 Encounter for general adult medical examination without abnormal findings: Secondary | ICD-10-CM | POA: Diagnosis not present

## 2024-04-20 ENCOUNTER — Other Ambulatory Visit: Payer: Self-pay | Admitting: Physician Assistant

## 2024-04-20 DIAGNOSIS — Z780 Asymptomatic menopausal state: Secondary | ICD-10-CM

## 2024-11-21 ENCOUNTER — Ambulatory Visit: Admitting: Podiatry

## 2024-11-21 VITALS — Ht 63.0 in | Wt 145.0 lb

## 2024-11-21 DIAGNOSIS — M21611 Bunion of right foot: Secondary | ICD-10-CM | POA: Diagnosis not present

## 2024-11-21 DIAGNOSIS — M21619 Bunion of unspecified foot: Secondary | ICD-10-CM

## 2024-11-21 DIAGNOSIS — M2011 Hallux valgus (acquired), right foot: Secondary | ICD-10-CM | POA: Diagnosis not present

## 2024-11-21 DIAGNOSIS — L84 Corns and callosities: Secondary | ICD-10-CM

## 2024-11-25 NOTE — Progress Notes (Signed)
"  °  Subjective:  Patient ID: Kerri Harmon, female    DOB: December 06, 1931,  MRN: [id number]  Chief Complaint  Patient presents with   Bunions    Rm 4 Patient is here for a callus between the right 1st and 2nd toes.     89 y.o. female presents with the above complaint. History confirmed with patient.  This is similar to the issue but now its on the second toe and on the first  Objective:  Physical Exam: warm, good capillary refill, no trophic changes or ulcerative lesions, normal DP and PT pulses, and normal sensory exam. Left Foot: dystrophic yellowed discolored nail plates with subungual debris Right Foot: dystrophic yellowed discolored nail plates with subungual debris and hallux valgus deformity with callus on the medial second toe where it abuts the hallux   Assessment:   1. Callus of foot   2. Hallux valgus with bunions, right      Plan:  Patient was evaluated and treated and all questions answered.    We again discussed that the calluses forming from the hallux abutting the second toe from her bunion deformity.  Do not think these are reconstructable at her age.  Callus was debrided of hyperkeratotic tissue.  Offloading silicone pad dispensed and I recommended using these with nonoperative treatment.  Return to see me as needed  No follow-ups on file.   "
